# Patient Record
Sex: Female | Born: 1960 | Race: White | Hispanic: No | Marital: Married | Smoking: Never smoker
Health system: Southern US, Community
[De-identification: ages and names within clinical notes are randomized; demographics above are authoritative.]

## PROBLEM LIST (undated history)

## (undated) DIAGNOSIS — I10 Essential (primary) hypertension: Secondary | ICD-10-CM

## (undated) DIAGNOSIS — I739 Peripheral vascular disease, unspecified: Secondary | ICD-10-CM

## (undated) DIAGNOSIS — E785 Hyperlipidemia, unspecified: Secondary | ICD-10-CM

## (undated) HISTORY — DX: Hyperlipidemia, unspecified: E78.5

## (undated) HISTORY — DX: Peripheral vascular disease, unspecified: I73.9

## (undated) HISTORY — DX: Essential (primary) hypertension: I10

## (undated) HISTORY — PX: OTHER SURGICAL HISTORY: SHX169

## (undated) HISTORY — PX: CATARACT EXTRACTION: SUR2

---

## 1998-08-21 ENCOUNTER — Other Ambulatory Visit: Admission: RE | Admit: 1998-08-21 | Discharge: 1998-08-21 | Payer: Self-pay | Admitting: Obstetrics and Gynecology

## 1999-10-05 ENCOUNTER — Other Ambulatory Visit: Admission: RE | Admit: 1999-10-05 | Discharge: 1999-10-05 | Payer: Self-pay | Admitting: Obstetrics and Gynecology

## 1999-10-14 ENCOUNTER — Encounter: Admission: RE | Admit: 1999-10-14 | Discharge: 1999-10-14 | Payer: Self-pay | Admitting: Obstetrics and Gynecology

## 1999-10-14 ENCOUNTER — Encounter: Payer: Self-pay | Admitting: Obstetrics and Gynecology

## 2000-05-04 ENCOUNTER — Ambulatory Visit (HOSPITAL_COMMUNITY): Admission: RE | Admit: 2000-05-04 | Discharge: 2000-05-04 | Payer: Self-pay | Admitting: *Deleted

## 2000-05-04 ENCOUNTER — Encounter: Payer: Self-pay | Admitting: *Deleted

## 2000-10-19 ENCOUNTER — Other Ambulatory Visit: Admission: RE | Admit: 2000-10-19 | Discharge: 2000-10-19 | Payer: Self-pay | Admitting: Obstetrics and Gynecology

## 2001-11-20 ENCOUNTER — Other Ambulatory Visit: Admission: RE | Admit: 2001-11-20 | Discharge: 2001-11-20 | Payer: Self-pay | Admitting: Obstetrics and Gynecology

## 2003-01-14 ENCOUNTER — Other Ambulatory Visit: Admission: RE | Admit: 2003-01-14 | Discharge: 2003-01-14 | Payer: Self-pay | Admitting: Obstetrics and Gynecology

## 2004-01-24 ENCOUNTER — Other Ambulatory Visit: Admission: RE | Admit: 2004-01-24 | Discharge: 2004-01-24 | Payer: Self-pay | Admitting: Obstetrics and Gynecology

## 2005-03-17 ENCOUNTER — Other Ambulatory Visit: Admission: RE | Admit: 2005-03-17 | Discharge: 2005-03-17 | Payer: Self-pay | Admitting: Obstetrics and Gynecology

## 2008-07-30 ENCOUNTER — Encounter: Admission: RE | Admit: 2008-07-30 | Discharge: 2008-07-30 | Payer: Self-pay | Admitting: Obstetrics and Gynecology

## 2009-07-06 ENCOUNTER — Inpatient Hospital Stay (HOSPITAL_COMMUNITY): Admission: AD | Admit: 2009-07-06 | Discharge: 2009-07-06 | Payer: Self-pay | Admitting: Obstetrics and Gynecology

## 2009-08-15 ENCOUNTER — Encounter: Admission: RE | Admit: 2009-08-15 | Discharge: 2009-08-15 | Payer: Self-pay | Admitting: Obstetrics and Gynecology

## 2010-08-26 ENCOUNTER — Encounter
Admission: RE | Admit: 2010-08-26 | Discharge: 2010-08-26 | Payer: Self-pay | Source: Home / Self Care | Attending: Obstetrics and Gynecology | Admitting: Obstetrics and Gynecology

## 2010-12-10 LAB — CBC
Hemoglobin: 12.3 g/dL (ref 12.0–15.0)
MCHC: 34.8 g/dL (ref 30.0–36.0)
RBC: 3.83 MIL/uL — ABNORMAL LOW (ref 3.87–5.11)
WBC: 5.5 10*3/uL (ref 4.0–10.5)

## 2011-07-13 ENCOUNTER — Other Ambulatory Visit: Payer: Self-pay | Admitting: Obstetrics and Gynecology

## 2011-07-13 DIAGNOSIS — Z1231 Encounter for screening mammogram for malignant neoplasm of breast: Secondary | ICD-10-CM

## 2011-09-03 ENCOUNTER — Ambulatory Visit
Admission: RE | Admit: 2011-09-03 | Discharge: 2011-09-03 | Disposition: A | Discharge status: Home / Self Care | Payer: Commercial Managed Care - PPO | Source: Ambulatory Visit | Attending: Obstetrics and Gynecology | Admitting: Obstetrics and Gynecology

## 2011-09-03 DIAGNOSIS — Z1231 Encounter for screening mammogram for malignant neoplasm of breast: Secondary | ICD-10-CM

## 2012-07-11 ENCOUNTER — Other Ambulatory Visit: Payer: Self-pay | Admitting: Obstetrics and Gynecology

## 2012-07-11 DIAGNOSIS — Z1231 Encounter for screening mammogram for malignant neoplasm of breast: Secondary | ICD-10-CM

## 2012-09-28 ENCOUNTER — Ambulatory Visit
Admission: RE | Admit: 2012-09-28 | Discharge: 2012-09-28 | Disposition: A | Discharge status: Home / Self Care | Payer: Commercial Managed Care - PPO | Source: Ambulatory Visit | Attending: Obstetrics and Gynecology | Admitting: Obstetrics and Gynecology

## 2012-09-28 DIAGNOSIS — Z1231 Encounter for screening mammogram for malignant neoplasm of breast: Secondary | ICD-10-CM

## 2013-09-21 ENCOUNTER — Other Ambulatory Visit: Payer: Self-pay

## 2013-09-21 DIAGNOSIS — Z1231 Encounter for screening mammogram for malignant neoplasm of breast: Secondary | ICD-10-CM

## 2013-10-12 ENCOUNTER — Ambulatory Visit
Admission: RE | Admit: 2013-10-12 | Discharge: 2013-10-12 | Disposition: A | Discharge status: Home / Self Care | Payer: 59 | Source: Ambulatory Visit

## 2013-10-12 DIAGNOSIS — Z1231 Encounter for screening mammogram for malignant neoplasm of breast: Secondary | ICD-10-CM

## 2013-10-16 ENCOUNTER — Other Ambulatory Visit: Payer: Self-pay | Admitting: Obstetrics and Gynecology

## 2013-10-16 DIAGNOSIS — R928 Other abnormal and inconclusive findings on diagnostic imaging of breast: Secondary | ICD-10-CM

## 2013-10-19 ENCOUNTER — Ambulatory Visit
Admission: RE | Admit: 2013-10-19 | Discharge: 2013-10-19 | Disposition: A | Discharge status: Home / Self Care | Payer: 59 | Source: Ambulatory Visit | Attending: Obstetrics and Gynecology | Admitting: Obstetrics and Gynecology

## 2013-10-19 DIAGNOSIS — R928 Other abnormal and inconclusive findings on diagnostic imaging of breast: Secondary | ICD-10-CM

## 2014-01-05 ENCOUNTER — Encounter (HOSPITAL_COMMUNITY): Payer: Self-pay | Admitting: Emergency Medicine

## 2014-01-05 ENCOUNTER — Emergency Department (HOSPITAL_COMMUNITY)
Admission: EM | Admit: 2014-01-05 | Discharge: 2014-01-06 | Disposition: A | Discharge status: Home / Self Care | Payer: 59 | Attending: Emergency Medicine | Admitting: Emergency Medicine

## 2014-01-05 ENCOUNTER — Emergency Department (HOSPITAL_COMMUNITY): Payer: 59

## 2014-01-05 DIAGNOSIS — S01501A Unspecified open wound of lip, initial encounter: Secondary | ICD-10-CM | POA: Insufficient documentation

## 2014-01-05 DIAGNOSIS — S6390XA Sprain of unspecified part of unspecified wrist and hand, initial encounter: Secondary | ICD-10-CM | POA: Insufficient documentation

## 2014-01-05 DIAGNOSIS — W19XXXA Unspecified fall, initial encounter: Secondary | ICD-10-CM

## 2014-01-05 DIAGNOSIS — S01511A Laceration without foreign body of lip, initial encounter: Secondary | ICD-10-CM

## 2014-01-05 DIAGNOSIS — IMO0002 Reserved for concepts with insufficient information to code with codable children: Secondary | ICD-10-CM | POA: Insufficient documentation

## 2014-01-05 DIAGNOSIS — Y929 Unspecified place or not applicable: Secondary | ICD-10-CM | POA: Insufficient documentation

## 2014-01-05 DIAGNOSIS — S6392XA Sprain of unspecified part of left wrist and hand, initial encounter: Secondary | ICD-10-CM

## 2014-01-05 DIAGNOSIS — S8002XA Contusion of left knee, initial encounter: Secondary | ICD-10-CM

## 2014-01-05 DIAGNOSIS — T07XXXA Unspecified multiple injuries, initial encounter: Secondary | ICD-10-CM

## 2014-01-05 DIAGNOSIS — S025XXA Fracture of tooth (traumatic), initial encounter for closed fracture: Secondary | ICD-10-CM | POA: Insufficient documentation

## 2014-01-05 DIAGNOSIS — S8000XA Contusion of unspecified knee, initial encounter: Secondary | ICD-10-CM | POA: Insufficient documentation

## 2014-01-05 DIAGNOSIS — Y93K1 Activity, walking an animal: Secondary | ICD-10-CM | POA: Insufficient documentation

## 2014-01-05 DIAGNOSIS — Z23 Encounter for immunization: Secondary | ICD-10-CM | POA: Insufficient documentation

## 2014-01-05 DIAGNOSIS — S0993XA Unspecified injury of face, initial encounter: Secondary | ICD-10-CM

## 2014-01-05 DIAGNOSIS — W1809XA Striking against other object with subsequent fall, initial encounter: Secondary | ICD-10-CM | POA: Insufficient documentation

## 2014-01-05 MED ORDER — TETANUS-DIPHTH-ACELL PERTUSSIS 5-2.5-18.5 LF-MCG/0.5 IM SUSP
0.5000 mL | Freq: Once | INTRAMUSCULAR | Status: AC
Start: 2014-01-05 — End: 2014-01-05
  Administered 2014-01-05: 0.5 mL via INTRAMUSCULAR
  Filled 2014-01-05: qty 0.5

## 2014-01-05 NOTE — ED Notes (Signed)
Pt states she was out walking and tripped on the lip of the sidewalk causing her to fall   Pt has facial trauma noted with a laceration below her left eye, bruising noted, laceration to her lip and injury to her front left tooth  Pt also has an abrasion to her top lip and c/o pain to her left hand and left knee

## 2014-01-05 NOTE — ED Provider Notes (Signed)
CSN: [id number]     Arrival date & time 01/05/14  2220 History   First MD Initiated Contact with Patient 01/05/14 2244     Chief Complaint  Patient presents with  . Fall   HPI  History provided by the patient. The patient is a 53 year old female with no significant PMH presenting with injuries after a fall. Patient was walking her dogs outside tripped over a crack in the sidewalk causing her to fall forward hitting her left knee and left hand and left face. She has multiple abrasions with small amounts of bleeding. Denies any LOC. Patient took 2 Tylenol and use some ice over swelling of her face with some improvements. He continues to have pains in her left hand and face area. Mild pains in knees. She has been a ambulatory does not report significant pain with walking or pressure. Patient is unsure of her last tetanus shot. No other aggravating or alleviating factors. No neck or back pains. No chest pains or shortness of breath. No weakness or numbness in extremities.    History reviewed. No pertinent past medical history. Past Surgical History  Procedure Laterality Date  . C section x 2     Family History  Problem Relation Age of Onset  . Cancer Other    History  Substance Use Topics  . Smoking status: Never Smoker   . Smokeless tobacco: Not on file  . Alcohol Use: No   OB History   Grav Para Term Preterm Abortions TAB SAB Ect Mult Living                 Review of Systems  Eyes: Negative for visual disturbance.  Gastrointestinal: Negative for nausea and vomiting.  Musculoskeletal: Negative for back pain and neck pain.  All other systems reviewed and are negative.     Allergies  Review of patient's allergies indicates no known allergies.  Home Medications   Prior to Admission medications   Not on File   BP 131/73  Pulse 63  Temp(Src) 98.7 F (37.1 C) (Oral)  Resp 19  Ht 5\' 6"  (1.676 m)  Wt 159 lb (72.122 kg)  BMI 25.68 kg/m2  SpO2 100% Physical Exam  Nursing  note and vitals reviewed. Constitutional: She is oriented to person, place, and time. She appears well-developed and well-nourished. No distress.  HENT:  Head: Normocephalic.  Abrasions over the left face and cheek area. There is small laceration to lower lip. Swelling of the upper lip. There is injury to the left upper central incisor with displacement into the mouth. No fracture to the tooth. No bleeding of the gums. Nose is normal.  Eyes: Conjunctivae and EOM are normal. Pupils are equal, round, and reactive to light.  Cardiovascular: Normal rate and regular rhythm.   Pulmonary/Chest: Effort normal and breath sounds normal.  Abdominal: Soft.  Neurological: She is alert and oriented to person, place, and time. She has normal strength. No cranial nerve deficit or sensory deficit.  Skin: Skin is warm and dry. No rash noted.  Psychiatric: She has a normal mood and affect. Her behavior is normal.    ED Course  Procedures   COORDINATION OF CARE:  Nursing notes reviewed. Vital signs reviewed. Initial pt interview and examination performed.   Filed Vitals:   01/05/14 2229  BP: 131/73  Pulse: 63  Temp: 98.7 F (37.1 C)  TempSrc: Oral  Resp: 19  Height: 5\' 6"  (1.676 m)  Weight: 159 lb (72.122 kg)  SpO2: 100%  11:05 PM-patient seen and evaluated. Patient signs of injuries from a fall. Awake and alert. No LOC. Discussed plan for x-rays of left hand and CT scan of face. Patient agrees. She does not request any pain medication at this time.  X-rays reviewed with patient. No signs of fractures or other concerning injuries. Discussed findings with patient. We'll place him in a splint. Patient given dental oral surgery referral for dental injury. Patient instructed to use rest, ice, compression and elevation.  She agrees with plan.  Imaging Review Dg Hand Complete Left  01/05/2014   CLINICAL DATA:  Fall.  EXAM: LEFT HAND - COMPLETE 3+ VIEW  COMPARISON:  None.  FINDINGS: Degenerative  changes in the carpus and interphalangeal joints. No evidence of acute fracture or dislocation in the left hand. Soft tissues are unremarkable.  IMPRESSION: Degenerative changes.  No acute bony abnormalities.   Electronically Signed   By: Burman NievesWilliam  Stevens M.D.   On: 01/05/2014 23:32   Ct Maxillofacial Wo Cm  01/05/2014   CLINICAL DATA:  Injury after a fall. Laceration to the left cheek and concerned about anterior to the injury. Bruising. Pain.  EXAM: CT MAXILLOFACIAL WITHOUT CONTRAST  TECHNIQUE: Multidetector CT imaging of the maxillofacial structures was performed. Multiplanar CT image reconstructions were also generated. A small metallic BB was placed on the right temple in order to reliably differentiate right from left.  COMPARISON:  None.  FINDINGS: Soft tissue swelling/ hematomas over the maxillary regions bilaterally, greater on the left. The globes and extraocular muscles appear intact and symmetrical. The orbital rims, maxillary antral walls, nasal bones, nasal septum, maxilla, zygomatic arches, pterygoid plates, mandibles, and temporomandibular joints appear intact. No displaced fractures identified. 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 Paranasal sinuses are clear.  IMPRESSION: No acute orbital or facial fractures demonstrated.   Electronically Signed   By: Burman NievesWilliam  Stevens M.D.   On: 01/05/2014 23:38     MDM   Final diagnoses:  Fall  Abrasions of multiple sites  Dental injury  Laceration of lip  Sprain of hand, left  Contusion of knee, left        Angus Sellereter S Adrian Specht, PA-C 01/06/14 0007

## 2014-01-05 NOTE — Discharge Instructions (Signed)
Your x-ray and CAT scan did not show any signs of concerning fractures or injuries from your fall. At this time your providers feel you may return home and followup with the primary care provider. Use rest, ice, compression and elevation to reduce pain and swelling of your injured areas. Please also follow up with a dentist for continued evaluation of your dental injuries.    Abrasions An abrasion is a cut or scrape of the skin. Abrasions do not go through all layers of the skin. HOME CARE  If a bandage (dressing) was put on your wound, change it as told by your doctor. If the bandage sticks, soak it off with warm.  Wash the area with water and soap 2 times a day. Rinse off the soap. Pat the area dry with a clean towel.  Put on medicated cream (ointment) as told by your doctor.  Change your bandage right away if it gets wet or dirty.  Only take medicine as told by your doctor.  See your doctor within 24 48 hours to get your wound checked.  Check your wound for redness, puffiness (swelling), or yellowish-white fluid (pus). GET HELP RIGHT AWAY IF:   You have more pain in the wound.  You have redness, swelling, or tenderness around the wound.  You have pus coming from the wound.  You have a fever or lasting symptoms for more than 2 3 days.  You have a fever and your symptoms suddenly get worse.  You have a bad smell coming from the wound or bandage. MAKE SURE YOU:   Understand these instructions.  Will watch your condition.  Will get help right away if you are not doing well or get worse. Document Released: 02/09/2008 Document Revised: 05/17/2012 Document Reviewed: 07/27/2011 [facility] Patient Information 2014 [place], [place].   Dental Injury Your exam shows that you have injured your teeth. The treatment of broken teeth and other dental injuries depends on how badly they are hurt. All dental injuries should be checked as soon as possible by a dentist if there are:  Loose  teeth which may need to be wired or bonded with a plastic device to hold them in place.  Broken teeth with exposed tooth pulp which may cause a serious infection.  Painful teeth especially when you bite or chew.  Sharp tooth edges that cut your tongue or lips. Sometimes, antibiotics or pain medicine are prescribed to prevent infection and control pain. Eat a soft or liquid diet and rinse your mouth out after meals with warm water. You should see a dentist or return here at once if you have increased swelling, increased pain or uncontrolled bleeding from the site of your injury. SEEK MEDICAL CARE IF:   You have increased pain not controlled with medicines.  You have swelling around your tooth, in your face or neck.  You have bleeding which starts, continues, or gets worse.  You have a fever. Document Released: 08/23/2005 Document Revised: 11/15/2011 Document Reviewed: 08/22/2009 [facility] Patient Information 2014 [place], [place].   Sprain A sprain happens when the bands of tissue that connect bones and hold joints together (ligaments) stretch too much or tear. HOME CARE  Raise (elevate) the injured area to lessen puffiness (swelling).  Put ice on the injured area 2 times a day for 2 3 days.  Put ice in a plastic bag.  Place a towel between your skin and the bag.  Leave the ice on for 15 minutes.  Only take medicine as told  by your doctor.  Protect your injured area until your pain and stiffness go away.  Do not get your cast or splint wet. Cover your cast or splint with a plastic bag when you shower or take a bath. Do not swim in a pool.  Your doctor may suggest exercises during your recovery to keep from getting stiff. GET HELP RIGHT AWAY IF:   Your cast or splint becomes damaged.  Your pain gets worse. MAKE SURE YOU:   Understand these instructions.  Will watch this condition.  Will get help right away if you are not doing well or get worse. Document Released:  02/09/2008 Document Revised: 06/13/2013 Document Reviewed: 09/04/2011 [facility] Patient Information 2014 [place], [place].

## 2014-01-09 NOTE — ED Provider Notes (Signed)
Medical screening examination/treatment/procedure(s) were performed by non-physician practitioner and as supervising physician I was immediately available for consultation/collaboration.   Dallas Scorsone M Jaquane Boughner, MD 01/09/14 2219 

## 2014-03-19 ENCOUNTER — Other Ambulatory Visit: Payer: Self-pay | Admitting: Obstetrics and Gynecology

## 2014-03-19 DIAGNOSIS — N6001 Solitary cyst of right breast: Secondary | ICD-10-CM

## 2014-03-22 ENCOUNTER — Other Ambulatory Visit: Payer: Self-pay | Admitting: Obstetrics and Gynecology

## 2014-03-22 DIAGNOSIS — Z803 Family history of malignant neoplasm of breast: Secondary | ICD-10-CM

## 2014-04-08 ENCOUNTER — Ambulatory Visit
Admission: RE | Admit: 2014-04-08 | Discharge: 2014-04-08 | Disposition: A | Discharge status: Home / Self Care | Payer: 59 | Source: Ambulatory Visit | Attending: Obstetrics and Gynecology | Admitting: Obstetrics and Gynecology

## 2014-04-08 DIAGNOSIS — N6001 Solitary cyst of right breast: Secondary | ICD-10-CM

## 2014-09-17 ENCOUNTER — Other Ambulatory Visit: Payer: Self-pay | Admitting: Obstetrics and Gynecology

## 2014-09-17 DIAGNOSIS — N6310 Unspecified lump in the right breast, unspecified quadrant: Secondary | ICD-10-CM

## 2014-10-15 ENCOUNTER — Ambulatory Visit
Admission: RE | Admit: 2014-10-15 | Discharge: 2014-10-15 | Disposition: A | Discharge status: Home / Self Care | Payer: 59 | Source: Ambulatory Visit | Attending: Obstetrics and Gynecology | Admitting: Obstetrics and Gynecology

## 2014-10-15 DIAGNOSIS — N6310 Unspecified lump in the right breast, unspecified quadrant: Secondary | ICD-10-CM

## 2014-12-14 ENCOUNTER — Telehealth: Payer: Self-pay | Admitting: Nurse Practitioner

## 2014-12-14 DIAGNOSIS — N3000 Acute cystitis without hematuria: Secondary | ICD-10-CM

## 2014-12-14 MED ORDER — CIPROFLOXACIN HCL 500 MG PO TABS: 500.0000 mg | ORAL_TABLET | Freq: Two times a day (BID) | ORAL | Status: DC

## 2014-12-14 NOTE — Progress Notes (Signed)
We are sorry that you are not feeling well.  Here is how we plan to help!  Based on what you shared with me it looks like you most likely have a simple urinary tract infection.  A UTI (Urinary Tract Infection) is a bacterial infection of the bladder.  Most cases of urinary tract infections are simple to treat but a key part of your care is to encourage you to drink plenty of fluids and watch your symptoms carefully.  I have prescribed cipro 1 bid for 3 days..  Your symptoms should gradually improve. Call us if the burning in your urine worsens, you develop worsening fever, back pain or pelvic pain or if your symptoms do not resolve after completing the antibiotic.  Urinary tract infections can be prevented by drinking plenty of water to keep your body hydrated.  Also be sure when you wipe, wipe from front to back and don't hold it in!  If possible, empty your bladder every 4 hours.  Your e-visit answers were reviewed by a board certified advanced clinical practitioner to complete your personal care plan.  Depending on the condition, your plan could have included both over the counter or prescription medications.  If there is a problem please reply  once you have received a response from your provider.  Your safety is important to us.  If you have drug allergies check your prescription carefully.    You can use MyChart to ask questions about today's visit, request a non-urgent call back, or ask for a work or school excuse.  You will get an e-mail in the next two days asking about your experience.  I hope that your e-visit has been valuable and will speed your recovery. Thank you for using e-visits.

## 2014-12-14 NOTE — Progress Notes (Signed)
Waiting on patient response. questionaire not complete.

## 2014-12-14 NOTE — Addendum Note (Signed)
Addended by: Bennie PieriniMARTIN, Oree-MARGARET on: 12/14/2014 11:31 AM   Modules accepted: Orders

## 2015-07-29 IMAGING — CT CT MAXILLOFACIAL W/O CM
1 series · 16 of 30 positions shown, 20 images · non-contrast
Comparison: None.

CLINICAL DATA: Injury after a fall. Laceration to the left cheek
and concerned about anterior to the injury. Bruising. Pain.

EXAM:
CT MAXILLOFACIAL WITHOUT CONTRAST
TECHNIQUE: Multidetector CT imaging of the maxillofacial structures was
performed. Multiplanar CT image reconstructions were also generated.
A small metallic BB was placed on the right temple in order to
reliably differentiate right from left.

[Series 3: facial st · axial · 0.32mm/px · z∈[-218,-70]mm · 16 of 80 slices shown, 20 images]
[im 3/80  brain]
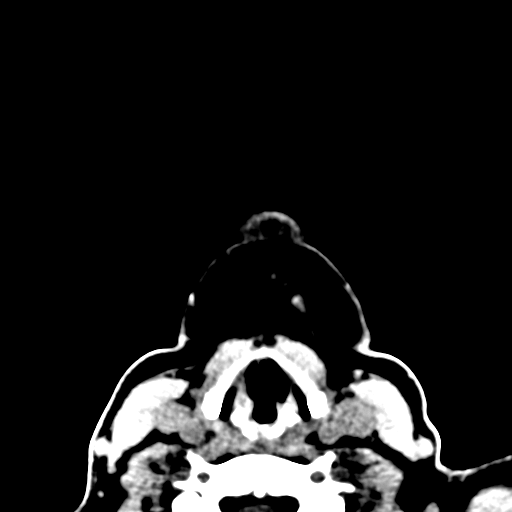
[im 3/80  bone]
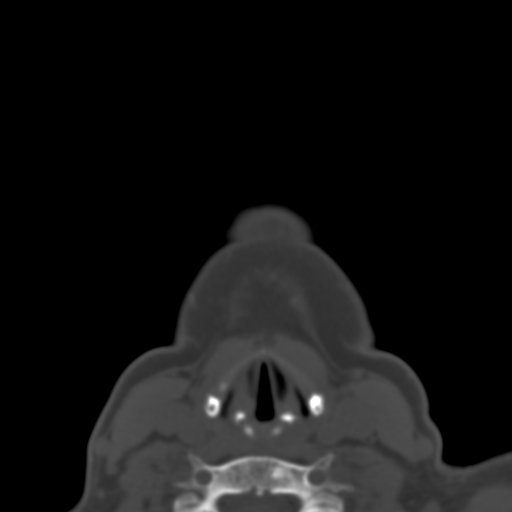
[im 9/80  bone]
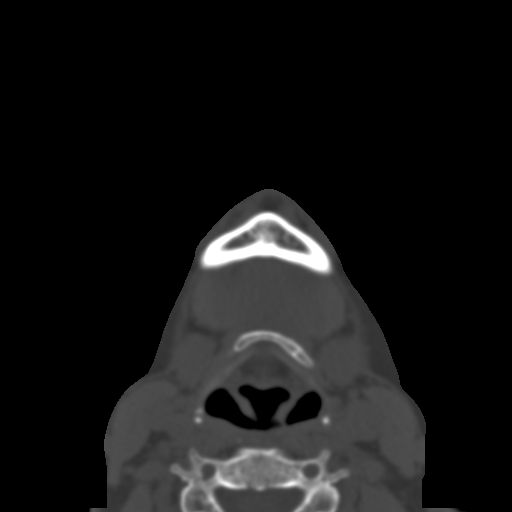
[im 14/80  bone]
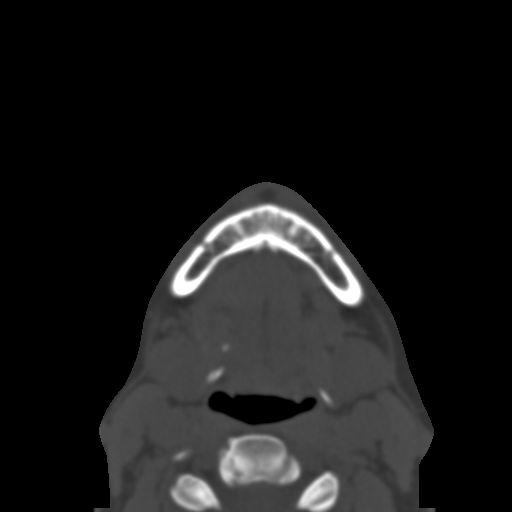
[im 20/80  bone]
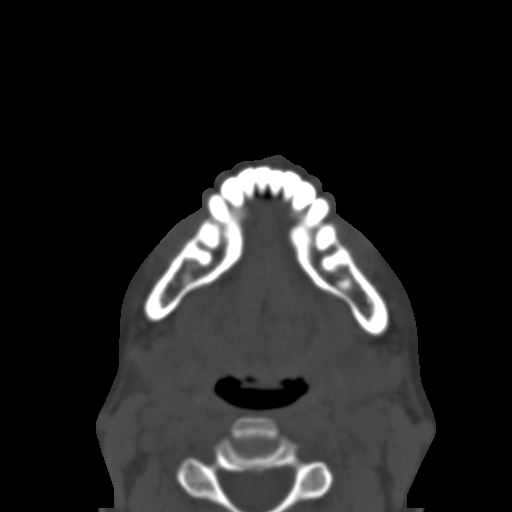
[im 22/80  brain]
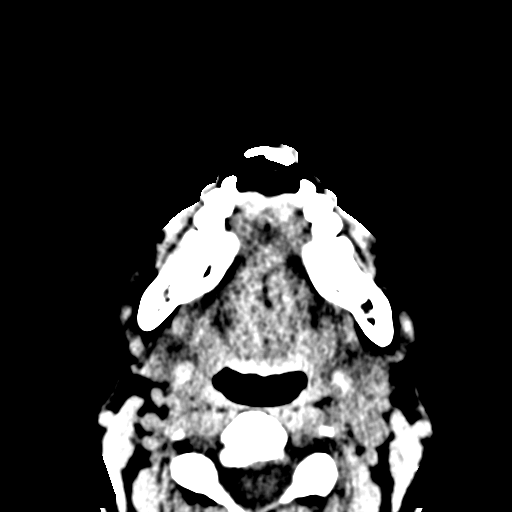
[im 22/80  bone]
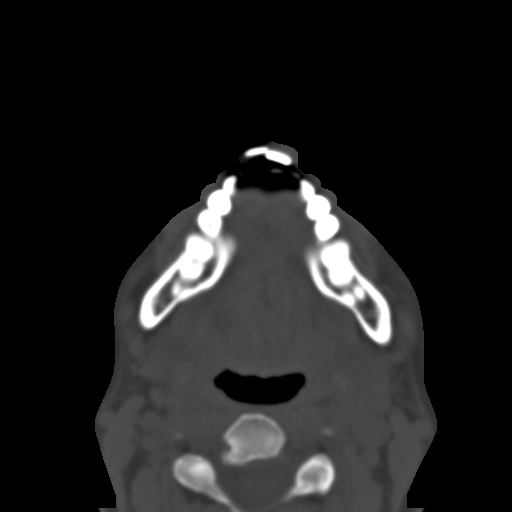
[im 28/80  bone]
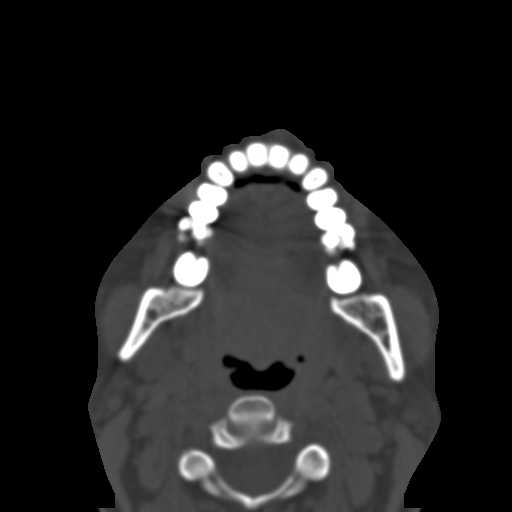
[im 33/80  bone]
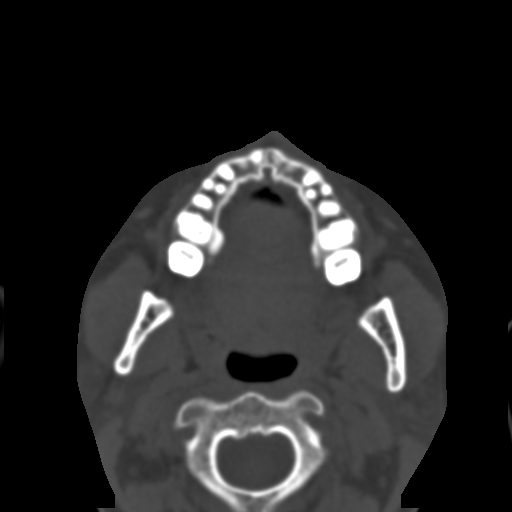
[im 39/80  bone]
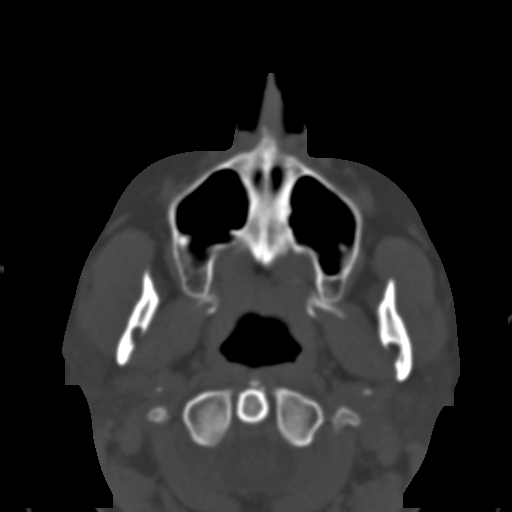
[im 41/80  brain]
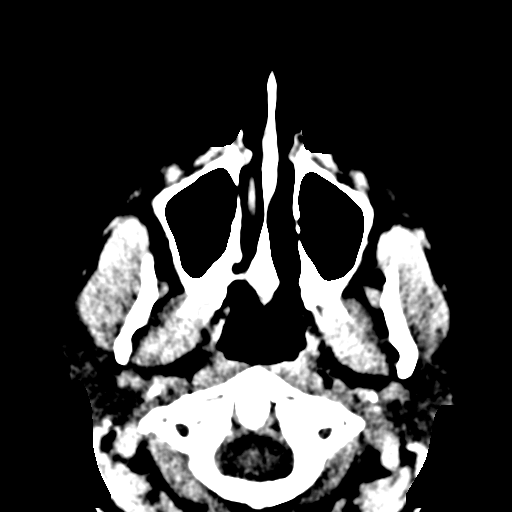
[im 41/80  bone]
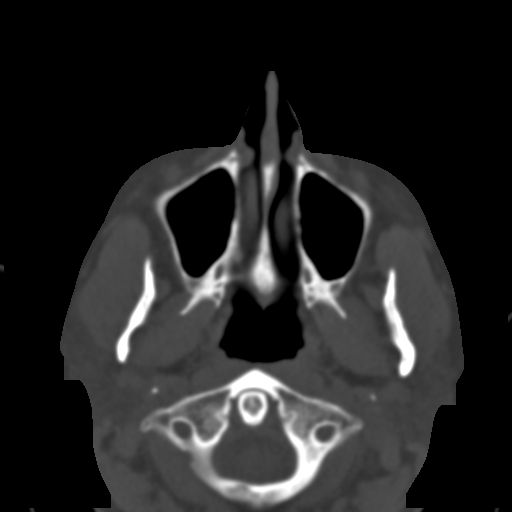
[im 47/80  bone]
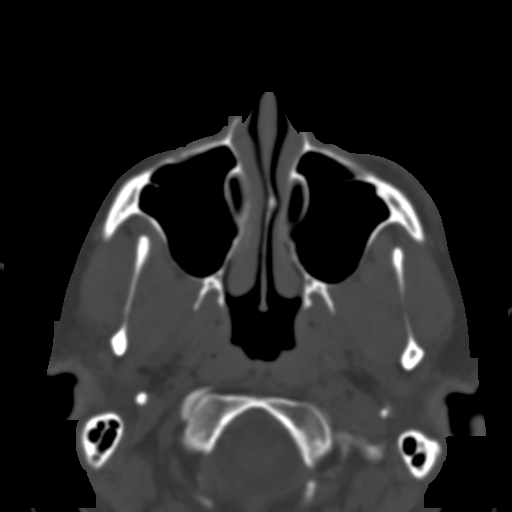
[im 52/80  bone]
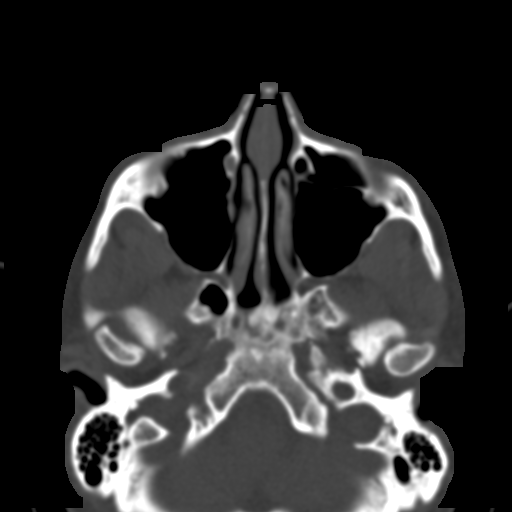
[im 58/80  bone]
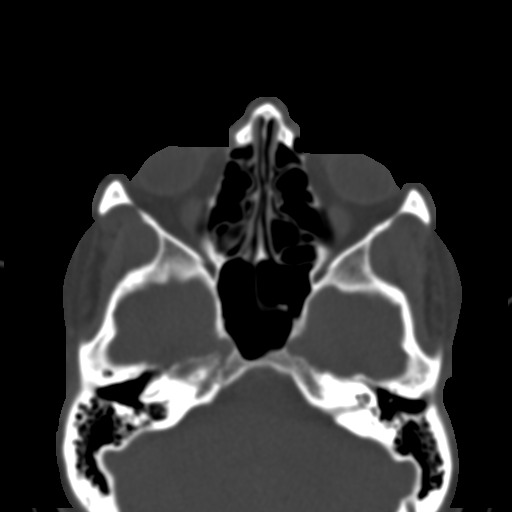
[im 60/80  brain]
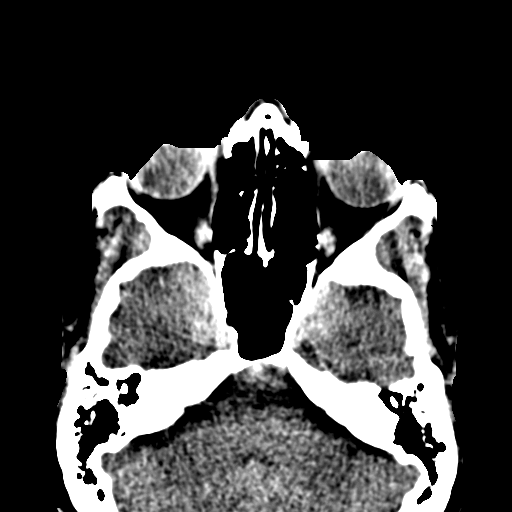
[im 60/80  bone]
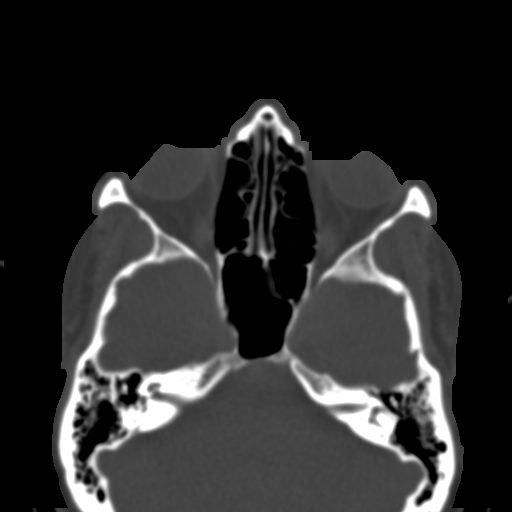
[im 66/80  bone]
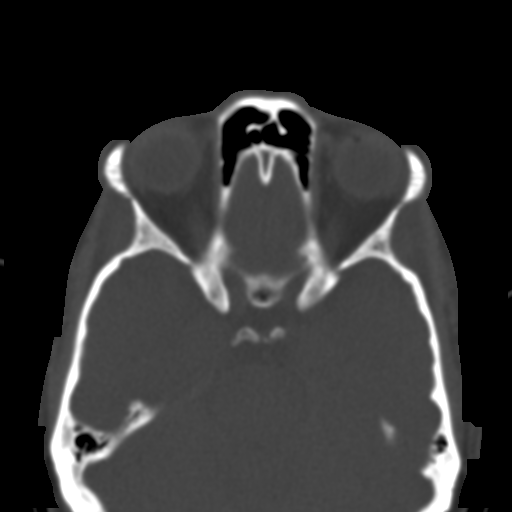
[im 71/80  bone]
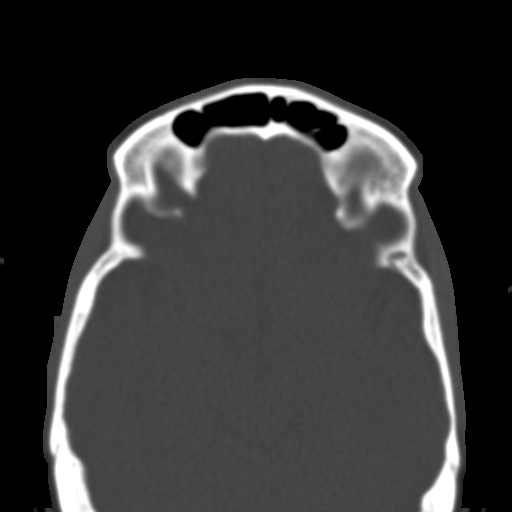
[im 77/80  bone]
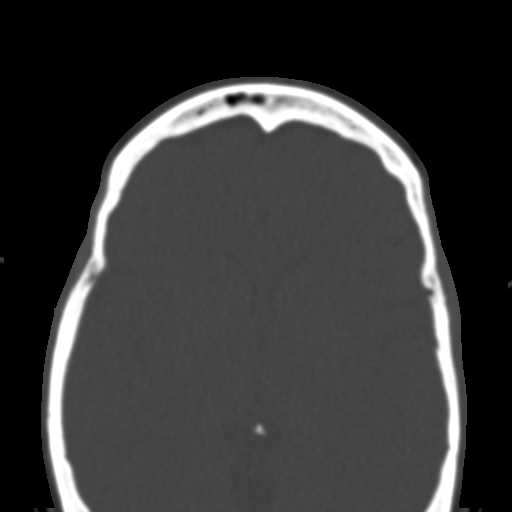

[16 of 30 positions shown; findings below may reference images not displayed]

FINDINGS: Soft tissue swelling/ hematomas over the maxillary regions
bilaterally, greater on the left. The globes and extraocular muscles
appear intact and symmetrical. The orbital rims, maxillary antral
walls, nasal bones, nasal septum, maxilla, zygomatic arches,
pterygoid plates, mandibles, and temporomandibular joints appear
intact. No displaced fractures identified. Visualized cervical spine
demonstrates reversal of the usual cervical lordosis with presumed
congenital coalition of C5-C6. Paranasal sinuses are clear.
IMPRESSION: No acute orbital or facial fractures demonstrated.

## 2015-10-08 ENCOUNTER — Other Ambulatory Visit: Payer: Self-pay

## 2015-10-08 DIAGNOSIS — Z1231 Encounter for screening mammogram for malignant neoplasm of breast: Secondary | ICD-10-CM

## 2015-11-05 ENCOUNTER — Ambulatory Visit
Admission: RE | Admit: 2015-11-05 | Discharge: 2015-11-05 | Disposition: A | Discharge status: Home / Self Care | Payer: 59 | Source: Ambulatory Visit

## 2015-11-05 DIAGNOSIS — Z1231 Encounter for screening mammogram for malignant neoplasm of breast: Secondary | ICD-10-CM

## 2015-11-07 ENCOUNTER — Other Ambulatory Visit: Payer: Self-pay | Admitting: Obstetrics and Gynecology

## 2015-11-07 DIAGNOSIS — R928 Other abnormal and inconclusive findings on diagnostic imaging of breast: Secondary | ICD-10-CM

## 2015-11-18 ENCOUNTER — Other Ambulatory Visit: Payer: Self-pay | Admitting: Obstetrics and Gynecology

## 2015-11-18 DIAGNOSIS — R928 Other abnormal and inconclusive findings on diagnostic imaging of breast: Secondary | ICD-10-CM

## 2015-11-20 ENCOUNTER — Ambulatory Visit
Admission: RE | Admit: 2015-11-20 | Discharge: 2015-11-20 | Disposition: A | Discharge status: Home / Self Care | Payer: 59 | Source: Ambulatory Visit | Attending: Obstetrics and Gynecology | Admitting: Obstetrics and Gynecology

## 2015-11-20 DIAGNOSIS — R928 Other abnormal and inconclusive findings on diagnostic imaging of breast: Secondary | ICD-10-CM

## 2016-11-04 ENCOUNTER — Other Ambulatory Visit: Payer: Self-pay | Admitting: Obstetrics and Gynecology

## 2016-11-04 DIAGNOSIS — Z1231 Encounter for screening mammogram for malignant neoplasm of breast: Secondary | ICD-10-CM

## 2016-12-15 ENCOUNTER — Ambulatory Visit
Admission: RE | Admit: 2016-12-15 | Discharge: 2016-12-15 | Disposition: A | Discharge status: Home / Self Care | Payer: 59 | Source: Ambulatory Visit | Attending: Obstetrics and Gynecology | Admitting: Obstetrics and Gynecology

## 2016-12-15 DIAGNOSIS — Z1231 Encounter for screening mammogram for malignant neoplasm of breast: Secondary | ICD-10-CM

## 2017-11-02 ENCOUNTER — Other Ambulatory Visit: Payer: Self-pay | Admitting: Obstetrics and Gynecology

## 2017-11-02 DIAGNOSIS — Z1231 Encounter for screening mammogram for malignant neoplasm of breast: Secondary | ICD-10-CM

## 2017-12-22 ENCOUNTER — Ambulatory Visit
Admission: RE | Admit: 2017-12-22 | Discharge: 2017-12-22 | Disposition: A | Discharge status: Home / Self Care | Payer: BLUE CROSS/BLUE SHIELD | Source: Ambulatory Visit | Attending: Obstetrics and Gynecology | Admitting: Obstetrics and Gynecology

## 2017-12-22 DIAGNOSIS — Z1231 Encounter for screening mammogram for malignant neoplasm of breast: Secondary | ICD-10-CM

## 2018-03-30 DIAGNOSIS — M25561 Pain in right knee: Secondary | ICD-10-CM | POA: Diagnosis not present

## 2018-03-30 DIAGNOSIS — M170 Bilateral primary osteoarthritis of knee: Secondary | ICD-10-CM | POA: Diagnosis not present

## 2018-07-12 DIAGNOSIS — Z961 Presence of intraocular lens: Secondary | ICD-10-CM | POA: Diagnosis not present

## 2018-07-12 DIAGNOSIS — Z01419 Encounter for gynecological examination (general) (routine) without abnormal findings: Secondary | ICD-10-CM | POA: Diagnosis not present

## 2018-07-12 DIAGNOSIS — Z6827 Body mass index (BMI) 27.0-27.9, adult: Secondary | ICD-10-CM | POA: Diagnosis not present

## 2018-07-12 DIAGNOSIS — H43813 Vitreous degeneration, bilateral: Secondary | ICD-10-CM | POA: Diagnosis not present

## 2018-07-18 DIAGNOSIS — Z1321 Encounter for screening for nutritional disorder: Secondary | ICD-10-CM | POA: Diagnosis not present

## 2018-07-18 DIAGNOSIS — Z8042 Family history of malignant neoplasm of prostate: Secondary | ICD-10-CM | POA: Diagnosis not present

## 2018-07-18 DIAGNOSIS — Z803 Family history of malignant neoplasm of breast: Secondary | ICD-10-CM | POA: Diagnosis not present

## 2018-07-18 DIAGNOSIS — E785 Hyperlipidemia, unspecified: Secondary | ICD-10-CM | POA: Diagnosis not present

## 2018-07-18 DIAGNOSIS — Z1329 Encounter for screening for other suspected endocrine disorder: Secondary | ICD-10-CM | POA: Diagnosis not present

## 2018-07-18 DIAGNOSIS — Z800 Family history of malignant neoplasm of digestive organs: Secondary | ICD-10-CM | POA: Diagnosis not present

## 2018-07-18 DIAGNOSIS — Z13228 Encounter for screening for other metabolic disorders: Secondary | ICD-10-CM | POA: Diagnosis not present

## 2018-10-04 DIAGNOSIS — E559 Vitamin D deficiency, unspecified: Secondary | ICD-10-CM | POA: Diagnosis not present

## 2018-10-04 DIAGNOSIS — Z809 Family history of malignant neoplasm, unspecified: Secondary | ICD-10-CM | POA: Diagnosis not present

## 2018-11-08 ENCOUNTER — Other Ambulatory Visit: Payer: Self-pay | Admitting: Obstetrics and Gynecology

## 2018-11-08 DIAGNOSIS — Z1231 Encounter for screening mammogram for malignant neoplasm of breast: Secondary | ICD-10-CM

## 2018-12-28 ENCOUNTER — Ambulatory Visit: Payer: BLUE CROSS/BLUE SHIELD

## 2019-01-23 DIAGNOSIS — E785 Hyperlipidemia, unspecified: Secondary | ICD-10-CM | POA: Diagnosis not present

## 2019-02-16 ENCOUNTER — Ambulatory Visit
Admission: RE | Admit: 2019-02-16 | Discharge: 2019-02-16 | Disposition: A | Discharge status: Home / Self Care | Payer: BC Managed Care – PPO | Source: Ambulatory Visit | Attending: Obstetrics and Gynecology | Admitting: Obstetrics and Gynecology

## 2019-02-16 ENCOUNTER — Other Ambulatory Visit: Payer: Self-pay

## 2019-02-16 DIAGNOSIS — Z1231 Encounter for screening mammogram for malignant neoplasm of breast: Secondary | ICD-10-CM | POA: Diagnosis not present

## 2019-02-23 DIAGNOSIS — Z20828 Contact with and (suspected) exposure to other viral communicable diseases: Secondary | ICD-10-CM | POA: Diagnosis not present

## 2019-07-18 DIAGNOSIS — Z961 Presence of intraocular lens: Secondary | ICD-10-CM | POA: Diagnosis not present

## 2019-07-18 DIAGNOSIS — H43813 Vitreous degeneration, bilateral: Secondary | ICD-10-CM | POA: Diagnosis not present

## 2019-08-23 DIAGNOSIS — Z13220 Encounter for screening for lipoid disorders: Secondary | ICD-10-CM | POA: Diagnosis not present

## 2019-08-23 DIAGNOSIS — Z6826 Body mass index (BMI) 26.0-26.9, adult: Secondary | ICD-10-CM | POA: Diagnosis not present

## 2019-08-23 DIAGNOSIS — Z01419 Encounter for gynecological examination (general) (routine) without abnormal findings: Secondary | ICD-10-CM | POA: Diagnosis not present

## 2019-12-20 DIAGNOSIS — Z0000 Encounter for general adult medical examination without abnormal findings: Secondary | ICD-10-CM | POA: Diagnosis not present

## 2019-12-20 DIAGNOSIS — Z1159 Encounter for screening for other viral diseases: Secondary | ICD-10-CM | POA: Diagnosis not present

## 2019-12-20 DIAGNOSIS — Z13220 Encounter for screening for lipoid disorders: Secondary | ICD-10-CM | POA: Diagnosis not present

## 2020-01-03 ENCOUNTER — Other Ambulatory Visit: Payer: Self-pay | Admitting: Obstetrics and Gynecology

## 2020-01-03 DIAGNOSIS — Z1231 Encounter for screening mammogram for malignant neoplasm of breast: Secondary | ICD-10-CM

## 2020-02-11 DIAGNOSIS — Z20822 Contact with and (suspected) exposure to covid-19: Secondary | ICD-10-CM | POA: Diagnosis not present

## 2020-03-04 ENCOUNTER — Other Ambulatory Visit: Payer: Self-pay | Admitting: Obstetrics and Gynecology

## 2020-03-04 ENCOUNTER — Other Ambulatory Visit: Payer: Self-pay

## 2020-03-04 ENCOUNTER — Ambulatory Visit
Admission: RE | Admit: 2020-03-04 | Discharge: 2020-03-04 | Disposition: A | Discharge status: Home / Self Care | Payer: BC Managed Care – PPO | Source: Ambulatory Visit | Attending: Obstetrics and Gynecology | Admitting: Obstetrics and Gynecology

## 2020-03-04 DIAGNOSIS — Z1231 Encounter for screening mammogram for malignant neoplasm of breast: Secondary | ICD-10-CM

## 2020-07-22 DIAGNOSIS — Z961 Presence of intraocular lens: Secondary | ICD-10-CM | POA: Diagnosis not present

## 2020-07-22 DIAGNOSIS — H43813 Vitreous degeneration, bilateral: Secondary | ICD-10-CM | POA: Diagnosis not present

## 2020-07-22 DIAGNOSIS — H26493 Other secondary cataract, bilateral: Secondary | ICD-10-CM | POA: Diagnosis not present

## 2020-08-25 DIAGNOSIS — Z01419 Encounter for gynecological examination (general) (routine) without abnormal findings: Secondary | ICD-10-CM | POA: Diagnosis not present

## 2020-08-25 DIAGNOSIS — Z6828 Body mass index (BMI) 28.0-28.9, adult: Secondary | ICD-10-CM | POA: Diagnosis not present

## 2020-12-25 DIAGNOSIS — Z20822 Contact with and (suspected) exposure to covid-19: Secondary | ICD-10-CM | POA: Diagnosis not present

## 2021-02-11 ENCOUNTER — Other Ambulatory Visit: Payer: Self-pay | Admitting: Obstetrics and Gynecology

## 2021-02-11 DIAGNOSIS — Z1231 Encounter for screening mammogram for malignant neoplasm of breast: Secondary | ICD-10-CM

## 2021-03-10 DIAGNOSIS — Z0000 Encounter for general adult medical examination without abnormal findings: Secondary | ICD-10-CM | POA: Diagnosis not present

## 2021-03-10 DIAGNOSIS — Z23 Encounter for immunization: Secondary | ICD-10-CM | POA: Diagnosis not present

## 2021-03-10 DIAGNOSIS — Z8639 Personal history of other endocrine, nutritional and metabolic disease: Secondary | ICD-10-CM | POA: Diagnosis not present

## 2021-03-10 DIAGNOSIS — Z13220 Encounter for screening for lipoid disorders: Secondary | ICD-10-CM | POA: Diagnosis not present

## 2021-04-08 ENCOUNTER — Other Ambulatory Visit: Payer: Self-pay

## 2021-04-08 ENCOUNTER — Ambulatory Visit
Admission: RE | Admit: 2021-04-08 | Discharge: 2021-04-08 | Disposition: A | Discharge status: Home / Self Care | Payer: BC Managed Care – PPO | Source: Ambulatory Visit | Attending: Obstetrics and Gynecology | Admitting: Obstetrics and Gynecology

## 2021-04-08 DIAGNOSIS — Z1231 Encounter for screening mammogram for malignant neoplasm of breast: Secondary | ICD-10-CM | POA: Diagnosis not present

## 2021-07-16 DIAGNOSIS — Z23 Encounter for immunization: Secondary | ICD-10-CM | POA: Diagnosis not present

## 2021-08-18 DIAGNOSIS — Z961 Presence of intraocular lens: Secondary | ICD-10-CM | POA: Diagnosis not present

## 2021-08-18 DIAGNOSIS — H43813 Vitreous degeneration, bilateral: Secondary | ICD-10-CM | POA: Diagnosis not present

## 2021-08-18 DIAGNOSIS — H26493 Other secondary cataract, bilateral: Secondary | ICD-10-CM | POA: Diagnosis not present

## 2021-08-26 DIAGNOSIS — Z01419 Encounter for gynecological examination (general) (routine) without abnormal findings: Secondary | ICD-10-CM | POA: Diagnosis not present

## 2021-08-26 DIAGNOSIS — Z6828 Body mass index (BMI) 28.0-28.9, adult: Secondary | ICD-10-CM | POA: Diagnosis not present

## 2021-08-26 DIAGNOSIS — Z13820 Encounter for screening for osteoporosis: Secondary | ICD-10-CM | POA: Diagnosis not present

## 2021-09-15 DIAGNOSIS — I10 Essential (primary) hypertension: Secondary | ICD-10-CM | POA: Diagnosis not present

## 2021-09-29 DIAGNOSIS — I10 Essential (primary) hypertension: Secondary | ICD-10-CM | POA: Diagnosis not present

## 2021-12-28 DIAGNOSIS — H43813 Vitreous degeneration, bilateral: Secondary | ICD-10-CM | POA: Diagnosis not present

## 2021-12-28 DIAGNOSIS — H26493 Other secondary cataract, bilateral: Secondary | ICD-10-CM | POA: Diagnosis not present

## 2022-01-25 DIAGNOSIS — H26493 Other secondary cataract, bilateral: Secondary | ICD-10-CM | POA: Diagnosis not present

## 2022-02-15 ENCOUNTER — Other Ambulatory Visit: Payer: Self-pay | Admitting: Obstetrics and Gynecology

## 2022-02-15 DIAGNOSIS — Z1231 Encounter for screening mammogram for malignant neoplasm of breast: Secondary | ICD-10-CM

## 2022-04-22 ENCOUNTER — Ambulatory Visit: Payer: BC Managed Care – PPO

## 2022-05-17 ENCOUNTER — Ambulatory Visit
Admission: RE | Admit: 2022-05-17 | Discharge: 2022-05-17 | Disposition: A | Discharge status: Home / Self Care | Payer: BC Managed Care – PPO | Source: Ambulatory Visit | Attending: Obstetrics and Gynecology | Admitting: Obstetrics and Gynecology

## 2022-05-17 DIAGNOSIS — Z1231 Encounter for screening mammogram for malignant neoplasm of breast: Secondary | ICD-10-CM | POA: Diagnosis not present

## 2022-05-21 DIAGNOSIS — Z13220 Encounter for screening for lipoid disorders: Secondary | ICD-10-CM | POA: Diagnosis not present

## 2022-05-21 DIAGNOSIS — I10 Essential (primary) hypertension: Secondary | ICD-10-CM | POA: Diagnosis not present

## 2022-05-21 DIAGNOSIS — Z0000 Encounter for general adult medical examination without abnormal findings: Secondary | ICD-10-CM | POA: Diagnosis not present

## 2022-05-21 DIAGNOSIS — Z8639 Personal history of other endocrine, nutritional and metabolic disease: Secondary | ICD-10-CM | POA: Diagnosis not present

## 2022-08-02 DIAGNOSIS — R0981 Nasal congestion: Secondary | ICD-10-CM | POA: Diagnosis not present

## 2022-08-02 DIAGNOSIS — H6122 Impacted cerumen, left ear: Secondary | ICD-10-CM | POA: Diagnosis not present

## 2022-08-02 DIAGNOSIS — H938X2 Other specified disorders of left ear: Secondary | ICD-10-CM | POA: Diagnosis not present

## 2022-08-13 ENCOUNTER — Other Ambulatory Visit (HOSPITAL_COMMUNITY): Payer: Self-pay | Admitting: Physician Assistant

## 2022-08-13 ENCOUNTER — Other Ambulatory Visit: Payer: Self-pay | Admitting: Physician Assistant

## 2022-08-13 DIAGNOSIS — I739 Peripheral vascular disease, unspecified: Secondary | ICD-10-CM

## 2022-08-13 DIAGNOSIS — M79662 Pain in left lower leg: Secondary | ICD-10-CM

## 2022-08-18 ENCOUNTER — Ambulatory Visit (HOSPITAL_COMMUNITY)
Admission: RE | Admit: 2022-08-18 | Discharge: 2022-08-18 | Disposition: A | Discharge status: Home / Self Care | Payer: BC Managed Care – PPO | Source: Ambulatory Visit | Attending: Physician Assistant | Admitting: Physician Assistant

## 2022-08-18 DIAGNOSIS — I739 Peripheral vascular disease, unspecified: Secondary | ICD-10-CM | POA: Diagnosis not present

## 2022-08-18 NOTE — Progress Notes (Signed)
[name] has been completed.   Preliminary results in CV Proc.   Morgan Leblanc [name] 08/18/2022 10:08 AM

## 2022-08-24 ENCOUNTER — Encounter: Payer: Self-pay | Admitting: Vascular Surgery

## 2022-08-24 ENCOUNTER — Ambulatory Visit (INDEPENDENT_AMBULATORY_CARE_PROVIDER_SITE_OTHER): Payer: BC Managed Care – PPO | Admitting: Vascular Surgery

## 2022-08-24 VITALS — BP 149/81 | HR 68 | Temp 97.3°F | Resp 14 | Ht 65.0 in | Wt 165.0 lb

## 2022-08-24 DIAGNOSIS — I739 Peripheral vascular disease, unspecified: Secondary | ICD-10-CM

## 2022-08-24 MED ORDER — CILOSTAZOL 100 MG PO TABS: 100.0000 mg | ORAL_TABLET | Freq: Two times a day (BID) | ORAL | 11 refills | Status: DC

## 2022-08-24 MED ORDER — ATORVASTATIN CALCIUM 10 MG PO TABS: 10.0000 mg | ORAL_TABLET | Freq: Every day | ORAL | 11 refills | Status: DC

## 2022-08-24 NOTE — Progress Notes (Addendum)
Patient name: Morgan Leblanc MRN: [id number] DOB: 11/14/1960 Sex: female  REASON FOR CONSULT: Evaluate leg pain, abnormal [name]  HPI: Morgan Leblanc is a 61 y.o. female, with history of hyperlipidemia that presents for evaluation of left leg pain.  This started several months ago.  After she walks about 1/10 of a mile she gets cramping in her left calf.  She does not feel it is pain but it is definitive cramp.  She usually can walk up to several miles but has been more limited recently due to her calf discomfort.  She felt this would eventually go away but it has not.  No rest pain at night.  No wounds to suggest critical limb ischemia.  No history of tobacco abuse.  She had ABIs on 08/18/2022 that were 1.07 on the right triphasic with a normal toe pressure.  They were 0.99 on the left triphasic with an abnormal toe pressure.  No previous instrumentation of her left groin or other intervention on her arteries in the past.  History reviewed. No pertinent past medical history.  Past Surgical History:  Procedure Laterality Date   c section x 2      Family History  Problem Relation Age of Onset   Breast cancer Mother    Cancer Other     SOCIAL HISTORY: Social History   Socioeconomic History   Marital status: Married    Spouse name: Not on file   Number of children: Not on file   Years of education: Not on file   Highest education level: Not on file  Occupational History   Not on file  Tobacco Use   Smoking status: Never   Smokeless tobacco: Not on file  Substance and Sexual Activity   Alcohol use: No   Drug use: No   Sexual activity: Not on file  Other Topics Concern   Not on file  Social History Narrative   Not on file   Social Determinants of Health   Financial Resource Strain: Not on file  Food Insecurity: Not on file  Transportation Needs: Not on file  Physical Activity: Not on file  Stress: Not on file  Social Connections: Not on file  Intimate Partner Violence: Not  on file    No Known Allergies  Current Outpatient Medications  Medication Sig Dispense Refill   Cholecalciferol (VITAMIN D3) 1000 units CAPS      lisinopril (ZESTRIL) 5 MG tablet Take 1 tablet by mouth daily.     Multiple Vitamin (MULTIVITAMIN WITH MINERALS) TABS tablet Take 1 tablet by mouth daily.     calcium carbonate (OS-CAL - DOSED IN MG OF ELEMENTAL CALCIUM) 1250 MG tablet Take 1 tablet by mouth daily with breakfast.     ciprofloxacin (CIPRO) 500 MG tablet Take 1 tablet (500 mg total) by mouth 2 (two) times daily. 20 tablet 0   No current facility-administered medications for this visit.    REVIEW OF SYSTEMS:  [X]  denotes positive finding, [ ]  denotes negative finding Cardiac  Comments:  Chest pain or chest pressure:    Shortness of breath upon exertion:    Short of breath when lying flat:    Irregular heart rhythm:        Vascular    Pain in calf, thigh, or hip brought on by ambulation: x Left leg  Pain in feet at night that wakes you up from your sleep:     Blood clot in your veins:    Leg swelling:  Pulmonary    Oxygen at home:    Productive cough:     Wheezing:         Neurologic    Sudden weakness in arms or legs:     Sudden numbness in arms or legs:     Sudden onset of difficulty speaking or slurred speech:    Temporary loss of vision in one eye:     Problems with dizziness:         Gastrointestinal    Blood in stool:     Vomited blood:         Genitourinary    Burning when urinating:     Blood in urine:        Psychiatric    Major depression:         Hematologic    Bleeding problems:    Problems with blood clotting too easily:        Skin    Rashes or ulcers:        Constitutional    Fever or chills:      PHYSICAL EXAM: Vitals:   08/24/22 1537  BP: (!) 149/81  Pulse: 68  Resp: 14  Temp: (!) 97.3 F (36.3 C)  TempSrc: Temporal  SpO2: 98%  Weight: 165 lb (74.8 kg)  Height: 5\' 5"  (1.651 m)    GENERAL: The patient is a  well-nourished female, in no acute distress. The vital signs are documented above. CARDIAC: There is a regular rate and rhythm.  VASCULAR:  Palpable femoral pulses bilaterally Palpable dorsalis pedis and posterior tibial pulses bilaterally PULMONARY: No respiratory distress. ABDOMEN: Soft and non-tender. MUSCULOSKELETAL: There are no major deformities or cyanosis. NEUROLOGIC: No focal weakness or paresthesias are detected. SKIN: There are no ulcers or rashes noted. PSYCHIATRIC: The patient has a normal affect.  DATA:   [name]'s 08/18/22: 1.07 right triphasic and 0.99 left triphasic - no significant lower extremity arterial disease   Assessment/Plan:  61 year old female presents for evaluation of left leg claudication that started several months ago.  This starts after she walks about 1/10 of a mile and she has distinct discomfort in the left calf that she describes as cramping.  On exam she does have palpable dorsalis pedis pulse.  She does have a flat toe tracing on the left with abnormal TBI.  Discussed her presentation is suggestive of vascular claudication although she does not significant risk factors other than HLD.  I discussed managing claudication with conservative measures until it becomes lifestyle limiting.  I discussed walking therapies with her as well as putting her on Pletal to see if this help.  I have also started her on a low-dose statin.  I will see her in 3 months to see her progress.  Discussed if worsening symptoms in the future and no improvement we can evaluate for an arteriogram in the future but again with her symptoms starting after walking 1/10 of a mile and then continuing to walk 2 miles, I would be very conservative at this point.   48, MD [facility] Office: [phone]

## 2022-09-23 DIAGNOSIS — Z01419 Encounter for gynecological examination (general) (routine) without abnormal findings: Secondary | ICD-10-CM | POA: Diagnosis not present

## 2022-09-23 DIAGNOSIS — Z6827 Body mass index (BMI) 27.0-27.9, adult: Secondary | ICD-10-CM | POA: Diagnosis not present

## 2022-11-08 DIAGNOSIS — L03011 Cellulitis of right finger: Secondary | ICD-10-CM | POA: Diagnosis not present

## 2022-11-10 ENCOUNTER — Ambulatory Visit
Admission: EM | Admit: 2022-11-10 | Discharge: 2022-11-10 | Disposition: A | Discharge status: Home / Self Care | Payer: BC Managed Care – PPO

## 2022-11-10 DIAGNOSIS — L03011 Cellulitis of right finger: Secondary | ICD-10-CM

## 2022-11-10 HISTORY — DX: Peripheral vascular disease, unspecified: I73.9

## 2022-11-10 NOTE — ED Triage Notes (Signed)
Pt c/o redness to right middle finger nailbed  since 3/2-seen by PCP 2/4-started on abx-was to f/u today due to pt feels area is worse-unable to get appt-NAD-steady gait

## 2022-11-10 NOTE — ED Provider Notes (Signed)
[facility]  Note:  This document was prepared using Systems analyst and may include unintentional dictation errors.  MRN: [id number] DOB: [birth year]  Subjective:   Morgan Leblanc is a 62 y.o. female presenting for 5-day history of acute onset persistent right middle finger pain about the nail.  Was seen 10/10/2022 and started on doxycycline for paronychia.  An incision and drainage was not performed.  She has gotten worse.  No current facility-administered medications for this encounter.  Current Outpatient Medications:  .  aspirin EC 81 MG tablet, Take 81 mg by mouth daily. Swallow whole., Disp: , Rfl:  .  atorvastatin (LIPITOR) 10 MG tablet, Take 10 mg by mouth daily., Disp: , Rfl:  .  atorvastatin (LIPITOR) 10 MG tablet, Take 1 tablet (10 mg total) by mouth daily., Disp: 30 tablet, Rfl: 11 .  calcium carbonate (OS-CAL - DOSED IN MG OF ELEMENTAL CALCIUM) 1250 MG tablet, Take 1 tablet by mouth daily with breakfast., Disp: , Rfl:  .  Cholecalciferol (VITAMIN D3) 1000 units CAPS, , Disp: , Rfl:  .  cilostazol (PLETAL) 100 MG tablet, Take 1 tablet (100 mg total) by mouth 2 (two) times daily before a meal., Disp: 60 tablet, Rfl: 11 .  ciprofloxacin (CIPRO) 500 MG tablet, Take 1 tablet (500 mg total) by mouth 2 (two) times daily., Disp: 20 tablet, Rfl: 0 .  lisinopril (ZESTRIL) 5 MG tablet, Take 1 tablet by mouth daily., Disp: , Rfl:  .  Multiple Vitamin (MULTIVITAMIN WITH MINERALS) TABS tablet, Take 1 tablet by mouth daily., Disp: , Rfl:    No Known Allergies  Past Medical History:  Diagnosis Date  . Claudication of left lower extremity [facility])      Past Surgical History:  Procedure Laterality Date  . c section x 2      Family History  Problem Relation Age of Onset  . Breast cancer Mother   . Cancer Other     Social History   Tobacco Use  . Smoking status: Never  . Smokeless tobacco: Never  Vaping Use  . Vaping Use: Never used   Substance Use Topics  . Alcohol use: No  . Drug use: No    ROS   Objective:   Vitals: BP (!) 142/94 (BP Location: Right Arm)   Pulse 62   Temp 98.2 F (36.8 C) (Oral)   Resp 16   SpO2 97%   Physical Exam Constitutional:      General: She is not in acute distress.    Appearance: Normal appearance. She is well-developed. She is not ill-appearing, toxic-appearing or diaphoretic.  HENT:     Head: Normocephalic and atraumatic.     Nose: Nose normal.     Mouth/Throat:     Mouth: Mucous membranes are moist.  Eyes:     General: No scleral icterus.       Right eye: No discharge.        Left eye: No discharge.     Extraocular Movements: Extraocular movements intact.  Cardiovascular:     Rate and Rhythm: Normal rate.  Pulmonary:     Effort: Pulmonary effort is normal.  Musculoskeletal:       Hands:  Skin:    General: Skin is warm and dry.  Neurological:     General: No focal deficit present.     Mental Status: She is alert and oriented to person, place, and time.  Psychiatric:  Mood and Affect: Mood normal.        Behavior: Behavior normal.   PROCEDURE NOTE: Paronychia I&D Verbal consent obtained. Local anesthesia/digital block with 3cc of 2% lidocaine without lidocaine. Site cleansed with Betadine.  Paronychia expressed using Adson forcep, discharge 2cc mixture of purulence, serosanguineous fluid. Cleansed and dressed.  Assessment and Plan :   PDMP not reviewed this encounter.  1. Paronychia of right middle finger     Successful I&D performed.  Wound care reviewed.  Continue doxycycline for the paronychia, ibuprofen for pain and inflammation. Counseled patient on potential for adverse effects with medications prescribed/recommended today, ER and return-to-clinic precautions discussed, patient verbalized understanding.    Jaynee Eagles, [place] 11/11/22 [phone]

## 2022-11-10 NOTE — Discharge Instructions (Addendum)
Please change your dressing 3-5 times daily. Do not apply any ointments or creams. Each time you change your dressing, make sure that you are pressing on the wound to get pus to come out.  Try your best to clean the wound with antibacterial soap and warm water. Pat your wound dry and let it air out if possible to make sure it is dry before reapplying another dressing.   Keep taking doxycycline for the infection. Use ibuprofen for the pain.

## 2022-11-19 ENCOUNTER — Other Ambulatory Visit: Payer: Self-pay | Admitting: Family Medicine

## 2022-11-19 DIAGNOSIS — I10 Essential (primary) hypertension: Secondary | ICD-10-CM | POA: Diagnosis not present

## 2022-11-19 DIAGNOSIS — E7849 Other hyperlipidemia: Secondary | ICD-10-CM | POA: Diagnosis not present

## 2022-11-19 DIAGNOSIS — Z136 Encounter for screening for cardiovascular disorders: Secondary | ICD-10-CM

## 2022-11-19 DIAGNOSIS — I739 Peripheral vascular disease, unspecified: Secondary | ICD-10-CM | POA: Diagnosis not present

## 2022-11-23 ENCOUNTER — Encounter: Payer: Self-pay | Admitting: Vascular Surgery

## 2022-11-23 ENCOUNTER — Ambulatory Visit (INDEPENDENT_AMBULATORY_CARE_PROVIDER_SITE_OTHER): Payer: BC Managed Care – PPO | Admitting: Vascular Surgery

## 2022-11-23 VITALS — BP 116/77 | HR 62 | Temp 97.3°F | Resp 14 | Ht 65.5 in | Wt 162.0 lb

## 2022-11-23 DIAGNOSIS — I739 Peripheral vascular disease, unspecified: Secondary | ICD-10-CM

## 2022-11-23 NOTE — Progress Notes (Signed)
Patient name: Morgan Leblanc MRN: [id number] DOB: 1960-12-22 Sex: female  REASON FOR CONSULT: 3 month follow-up, left leg claudication  HPI: Morgan Leblanc is a 62 y.o. female, with history of hyperlipidemia that presents for 3 month follow-up of her left leg pain that was felt to be consistent with vascular claudication.  She previously could walk about 1/10 of a mile when she got cramping in her left calf.   She had ABIs on 08/18/2022 that were 1.07 on the right triphasic with a normal toe pressure.  They were 0.99 on the left triphasic with an abnormal toe pressure.   We previously elected to manage her conservatively.  She could not tolerate the Pletal due to rapid heart rate and this was stopped.  She has remained on aspirin and statin.  Still gets cramping in her left calf after about 1/10 of a mile.  States she can walk up to 2 miles without stopping.  Usually does this 4 times a week.  This is worse when walking up an incline.  Past Medical History:  Diagnosis Date   Claudication of left lower extremity ([place])     Past Surgical History:  Procedure Laterality Date   c section x 2      Family History  Problem Relation Age of Onset   Breast cancer Mother    Cancer Other     SOCIAL HISTORY: Social History   Socioeconomic History   Marital status: Married    Spouse name: Not on file   Number of children: Not on file   Years of education: Not on file   Highest education level: Not on file  Occupational History   Not on file  Tobacco Use   Smoking status: Never   Smokeless tobacco: Never  Vaping Use   Vaping Use: Never used  Substance and Sexual Activity   Alcohol use: No   Drug use: No   Sexual activity: Not on file  Other Topics Concern   Not on file  Social History Narrative   Not on file   Social Determinants of Health   Financial Resource Strain: Not on file  Food Insecurity: Not on file  Transportation Needs: Not on file  Physical Activity: Not on file   Stress: Not on file  Social Connections: Not on file  Intimate Partner Violence: Not on file    No Known Allergies  Current Outpatient Medications  Medication Sig Dispense Refill   aspirin EC 81 MG tablet Take 81 mg by mouth daily. Swallow whole.     atorvastatin (LIPITOR) 10 MG tablet Take 1 tablet (10 mg total) by mouth daily. 30 tablet 11   Cholecalciferol (VITAMIN D3) 1000 units CAPS      lisinopril (ZESTRIL) 5 MG tablet Take 1 tablet by mouth daily.     Multiple Vitamin (MULTIVITAMIN WITH MINERALS) TABS tablet Take 1 tablet by mouth daily.     atorvastatin (LIPITOR) 10 MG tablet Take 10 mg by mouth daily.     calcium carbonate (OS-CAL - DOSED IN MG OF ELEMENTAL CALCIUM) 1250 MG tablet Take 1 tablet by mouth daily with breakfast.     cilostazol (PLETAL) 100 MG tablet Take 1 tablet (100 mg total) by mouth 2 (two) times daily before a meal. 60 tablet 11   ciprofloxacin (CIPRO) 500 MG tablet Take 1 tablet (500 mg total) by mouth 2 (two) times daily. 20 tablet 0   No current facility-administered medications for this visit.  REVIEW OF SYSTEMS:  [X]  denotes positive finding, [ ]  denotes negative finding Cardiac  Comments:  Chest pain or chest pressure:    Shortness of breath upon exertion:    Short of breath when lying flat:    Irregular heart rhythm:        Vascular    Pain in calf, thigh, or hip brought on by ambulation: x Left leg  Pain in feet at night that wakes you up from your sleep:     Blood clot in your veins:    Leg swelling:         Pulmonary    Oxygen at home:    Productive cough:     Wheezing:         Neurologic    Sudden weakness in arms or legs:     Sudden numbness in arms or legs:     Sudden onset of difficulty speaking or slurred speech:    Temporary loss of vision in one eye:     Problems with dizziness:         Gastrointestinal    Blood in stool:     Vomited blood:         Genitourinary    Burning when urinating:     Blood in urine:         Psychiatric    Major depression:         Hematologic    Bleeding problems:    Problems with blood clotting too easily:        Skin    Rashes or ulcers:        Constitutional    Fever or chills:      PHYSICAL EXAM: Vitals:   11/23/22 1601  BP: 116/77  Pulse: 62  Resp: 14  Temp: (!) 97.3 F (36.3 C)  TempSrc: Temporal  SpO2: 99%  Weight: 162 lb (73.5 kg)  Height: 5' 5.5" (1.664 m)    GENERAL: The patient is a well-nourished female, in no acute distress. The vital signs are documented above. CARDIAC: There is a regular rate and rhythm.  VASCULAR:  Palpable femoral pulses bilaterally Right DP 2+ palpable Left DP 1+ palpable PULMONARY: No respiratory distress. ABDOMEN: Soft and non-tender. MUSCULOSKELETAL: There are no major deformities or cyanosis. NEUROLOGIC: No focal weakness or paresthesias are detected. PSYCHIATRIC: The patient has a normal affect.  DATA:   [name]'s 08/18/22: 1.07 right triphasic and 0.99 left triphasic - no significant lower extremity arterial disease   Assessment/Plan:  62 year old female presents for 44-month follow-up of her left lower extremity claudication.  She still has a very classic description of claudication that starts after 1/10 of a mile with cramping in the left calf.  This is worse when walking up an incline.  She is usually able to walk through it and can go up to 2 miles when she is exercising.  I again discussed managing claudication with lifestyle modifications and not surgical intervention until it becomes lifestyle limiting.  Unfortunately she could not tolerate the Pletal and this was stopped.  She remains on aspirin and statin for risk reduction.  Overall pleased with her progress.  I again enforced the concept of walking therapies.  I will see her in 1 year with repeat ABIs.  I discussed she call me if she has any worsening symptoms before then.   Marty Heck, MD [facility] Office:  [phone]

## 2022-12-22 ENCOUNTER — Ambulatory Visit
Admission: RE | Admit: 2022-12-22 | Discharge: 2022-12-22 | Disposition: A | Discharge status: Home / Self Care | Payer: No Typology Code available for payment source | Source: Ambulatory Visit | Attending: Family Medicine | Admitting: Family Medicine

## 2022-12-22 DIAGNOSIS — Z136 Encounter for screening for cardiovascular disorders: Secondary | ICD-10-CM

## 2023-01-13 ENCOUNTER — Encounter: Payer: Self-pay | Admitting: Cardiology

## 2023-01-13 ENCOUNTER — Ambulatory Visit: Payer: BC Managed Care – PPO | Admitting: Cardiology

## 2023-01-13 VITALS — BP 121/80 | HR 75 | Ht 65.5 in | Wt 159.8 lb

## 2023-01-13 DIAGNOSIS — I739 Peripheral vascular disease, unspecified: Secondary | ICD-10-CM | POA: Diagnosis not present

## 2023-01-13 DIAGNOSIS — E782 Mixed hyperlipidemia: Secondary | ICD-10-CM | POA: Diagnosis not present

## 2023-01-13 MED ORDER — ATORVASTATIN CALCIUM 20 MG PO TABS: 20.0000 mg | ORAL_TABLET | Freq: Every day | ORAL | 3 refills | Status: AC

## 2023-01-13 NOTE — Progress Notes (Signed)
Patient referred by Laurann Montana, MD for elevated calcium score  Subjective:   Morgan Leblanc, female    DOB: 1960/12/20, 62 y.o.   MRN: [id number]   Chief Complaint  Patient presents with   PAD   New Patient (Initial Visit)   Claudication in peripheral vascular disease     HPI  62 y.o. Caucasian female with PAD, elevated calcium score.  Patient is a retired Engineer, civil (consulting).  She stays active with 2 to 3 miles walk regularly without recurrence of chest pain or shortness of breath.  She has mild claudication symptoms but able to walk about distances without any limiting claudication.  Reviewed recent CT cardiac scoring scan with the patient, details below.  Past Medical History:  Diagnosis Date   Claudication of left lower extremity (HCC)      Past Surgical History:  Procedure Laterality Date   c section x 2       Social History   Tobacco Use  Smoking Status Never  Smokeless Tobacco Never    Social History   Substance and Sexual Activity  Alcohol Use No     Family History  Problem Relation Age of Onset   Breast cancer Mother    Cancer Other       Current Outpatient Medications:    aspirin EC 81 MG tablet, Take 81 mg by mouth daily. Swallow whole., Disp: , Rfl:    atorvastatin (LIPITOR) 10 MG tablet, Take 1 tablet (10 mg total) by mouth daily., Disp: 30 tablet, Rfl: 11   cetirizine (ZYRTEC) 10 MG tablet, Take 10 mg by mouth daily., Disp: , Rfl:    Cholecalciferol (VITAMIN D3) 1000 units CAPS, Take 2,000 Units by mouth daily., Disp: , Rfl:    fluticasone (FLONASE) 50 MCG/ACT nasal spray, Place 1 spray into both nostrils daily., Disp: , Rfl:    lisinopril (ZESTRIL) 5 MG tablet, Take 1 tablet by mouth daily., Disp: , Rfl:    Multiple Vitamin (MULTIVITAMIN WITH MINERALS) TABS tablet, Take 1 tablet by mouth daily., Disp: , Rfl:    Cardiovascular and other pertinent studies:  Reviewed external labs and tests, independently interpreted  EKG 01/13/2023: Sinus  rhythm 63 bpm  Normal EKG   CT cardiac scoring 12/22/2022: LM: 0 LAD: 33.4 LCx: 0 RCA: 0.7   Total Agatston Score: 34.1 MESA database percentile: 82  3 mm subpleural left basilar pulmonary nodule. No follow-up needed if patient is low-risk.   Recent labs: 11/19/2022: Chol 162, TG 166, HDL 59, LDL 75  05/21/2022: Glucose 89, BUN/Cr 15/0.85. EGFR 79. Na/K 140/4.2. Rest of the CMP normal Chol 260, TG 205, HDL 61, LDL 162   05/2022: H/H 13/40. MCV 90. Platelets 241 TSH 2.4 normal   Review of Systems  Cardiovascular:  Positive for claudication. Negative for chest pain, dyspnea on exertion, leg swelling, palpitations and syncope.         Vitals:   01/13/23 1418  BP: 121/80  Pulse: 75  SpO2: 100%     Body mass index is 26.19 kg/m. Filed Weights   01/13/23 1418  Weight: 159 lb 12.8 oz (72.5 kg)     Objective:   Physical Exam Vitals and nursing note reviewed.  Constitutional:      General: She is not in acute distress. Neck:     Vascular: No JVD.  Cardiovascular:     Rate and Rhythm: Normal rate and regular rhythm.     Pulses:  Dorsalis pedis pulses are 2+ on the right side and 1+ on the left side.       Posterior tibial pulses are 1+ on the right side and 1+ on the left side.     Heart sounds: Normal heart sounds. No murmur heard. Pulmonary:     Effort: Pulmonary effort is normal.     Breath sounds: Normal breath sounds. No wheezing or rales.  Musculoskeletal:     Right lower leg: No edema.     Left lower leg: No edema.           Visit diagnoses:   ICD-10-CM   1. Claudication in peripheral vascular disease (HCC)  I73.9 EKG 12-Lead    2. Mixed hyperlipidemia  E78.2        Orders Placed This Encounter  Procedures   EKG 12-Lead     Medication changes this visit: Medications Discontinued During This Encounter  Medication Reason   atorvastatin (LIPITOR) 10 MG tablet Completed Course   ciprofloxacin (CIPRO) 500 MG tablet Completed  Course   cilostazol (PLETAL) 100 MG tablet Completed Course   calcium carbonate (OS-CAL - DOSED IN MG OF ELEMENTAL CALCIUM) 1250 MG tablet Completed Course   atorvastatin (LIPITOR) 10 MG tablet Reorder    Meds ordered this encounter  Medications   atorvastatin (LIPITOR) 20 MG tablet    Sig: Take 1 tablet (20 mg total) by mouth daily.    Dispense:  90 tablet    Refill:  3     Assessment & Recommendations:    62 y.o. Caucasian female with PAD, elevated calcium score.  Elevated coronary calcium score: 82nd percentile, none in left main. LDL down from 162 to 75 with Crestor 10 mg daily. Increase Crestor to 20 mg daily to further maximize LDL reduction.  PAD: Nonlifestyle limiting claudication. Continue aspirin 81 mg daily, along with statin. Continue heart healthy diet and exercise.   Thank you for referring the patient to [place]. Please feel free to contact with any questions.   Elder Negus, MD Pager: [phone] Office: [phone]

## 2023-01-14 ENCOUNTER — Encounter: Payer: Self-pay | Admitting: Cardiology

## 2023-02-18 DIAGNOSIS — H524 Presbyopia: Secondary | ICD-10-CM | POA: Diagnosis not present

## 2023-02-18 DIAGNOSIS — Z961 Presence of intraocular lens: Secondary | ICD-10-CM | POA: Diagnosis not present

## 2023-02-18 DIAGNOSIS — H43813 Vitreous degeneration, bilateral: Secondary | ICD-10-CM | POA: Diagnosis not present

## 2023-02-18 DIAGNOSIS — H26493 Other secondary cataract, bilateral: Secondary | ICD-10-CM | POA: Diagnosis not present

## 2023-02-18 DIAGNOSIS — H52223 Regular astigmatism, bilateral: Secondary | ICD-10-CM | POA: Diagnosis not present

## 2023-04-08 ENCOUNTER — Other Ambulatory Visit: Payer: Self-pay | Admitting: Obstetrics and Gynecology

## 2023-04-08 DIAGNOSIS — Z1231 Encounter for screening mammogram for malignant neoplasm of breast: Secondary | ICD-10-CM

## 2023-05-26 ENCOUNTER — Ambulatory Visit
Admission: RE | Admit: 2023-05-26 | Discharge: 2023-05-26 | Disposition: A | Discharge status: Home / Self Care | Payer: BC Managed Care – PPO | Source: Ambulatory Visit | Attending: Obstetrics and Gynecology | Admitting: Obstetrics and Gynecology

## 2023-05-26 ENCOUNTER — Ambulatory Visit: Payer: BC Managed Care – PPO

## 2023-05-26 DIAGNOSIS — Z1231 Encounter for screening mammogram for malignant neoplasm of breast: Secondary | ICD-10-CM | POA: Diagnosis not present

## 2023-06-03 DIAGNOSIS — I10 Essential (primary) hypertension: Secondary | ICD-10-CM | POA: Diagnosis not present

## 2023-06-03 DIAGNOSIS — Z8639 Personal history of other endocrine, nutritional and metabolic disease: Secondary | ICD-10-CM | POA: Diagnosis not present

## 2023-06-03 DIAGNOSIS — Z23 Encounter for immunization: Secondary | ICD-10-CM | POA: Diagnosis not present

## 2023-06-03 DIAGNOSIS — I739 Peripheral vascular disease, unspecified: Secondary | ICD-10-CM | POA: Diagnosis not present

## 2023-06-03 DIAGNOSIS — Z0000 Encounter for general adult medical examination without abnormal findings: Secondary | ICD-10-CM | POA: Diagnosis not present

## 2023-06-03 DIAGNOSIS — E7849 Other hyperlipidemia: Secondary | ICD-10-CM | POA: Diagnosis not present

## 2023-07-25 DIAGNOSIS — M792 Neuralgia and neuritis, unspecified: Secondary | ICD-10-CM | POA: Diagnosis not present

## 2023-07-25 DIAGNOSIS — L565 Disseminated superficial actinic porokeratosis (DSAP): Secondary | ICD-10-CM | POA: Diagnosis not present

## 2023-07-25 DIAGNOSIS — M205X2 Other deformities of toe(s) (acquired), left foot: Secondary | ICD-10-CM | POA: Diagnosis not present

## 2023-09-28 DIAGNOSIS — Z01419 Encounter for gynecological examination (general) (routine) without abnormal findings: Secondary | ICD-10-CM | POA: Diagnosis not present

## 2023-09-28 DIAGNOSIS — Z124 Encounter for screening for malignant neoplasm of cervix: Secondary | ICD-10-CM | POA: Diagnosis not present

## 2023-09-28 DIAGNOSIS — Z1151 Encounter for screening for human papillomavirus (HPV): Secondary | ICD-10-CM | POA: Diagnosis not present

## 2023-09-28 DIAGNOSIS — D649 Anemia, unspecified: Secondary | ICD-10-CM | POA: Diagnosis not present

## 2023-09-28 DIAGNOSIS — Z6825 Body mass index (BMI) 25.0-25.9, adult: Secondary | ICD-10-CM | POA: Diagnosis not present

## 2023-11-18 ENCOUNTER — Other Ambulatory Visit: Payer: Self-pay | Admitting: *Deleted

## 2023-11-18 DIAGNOSIS — I739 Peripheral vascular disease, unspecified: Secondary | ICD-10-CM

## 2023-11-28 NOTE — Progress Notes (Unsigned)
 Patient name: Morgan Leblanc MRN: [id number] DOB: September 03, 1961 Sex: female  REASON FOR CONSULT: 1 year follow-up, left leg claudication  HPI: Morgan Leblanc is a 63 y.o. female, with history of hyperlipidemia that presents for 1 year follow-up of her left leg pain that was felt to be consistent with vascular claudication.  She previously could walk about 1/10 of a mile when she got cramping in her left calf.   She had ABIs on 08/18/2022 that were 1.07 on the right triphasic with a normal toe pressure.  They were 0.99 on the left triphasic with an abnormal toe pressure.   We previously elected to manage her conservatively.  She could not tolerate the Pletal due to rapid heart rate and this was stopped.  She has remained on aspirin and statin.    Today she states she thinks she can walk a block and a half before her left calf cramps and then usually keeps walking more.  No worse.  Able to do most of her activities.  No rest pain or tissue loss.  Past Medical History:  Diagnosis Date   Claudication of left lower extremity (HCC)    PAD (peripheral artery disease) (HCC)     Past Surgical History:  Procedure Laterality Date   c section x 2     CATARACT EXTRACTION Bilateral     Family History  Problem Relation Age of Onset   Breast cancer Mother    Hypertension Father    Prostate cancer Father    Heart attack Maternal Grandfather     SOCIAL HISTORY: Social History   Socioeconomic History   Marital status: Married    Spouse name: Not on file   Number of children: Not on file   Years of education: Not on file   Highest education level: Not on file  Occupational History   Not on file  Tobacco Use   Smoking status: Never   Smokeless tobacco: Never  Vaping Use   Vaping status: Never Used  Substance and Sexual Activity   Alcohol use: No   Drug use: No   Sexual activity: Not on file  Other Topics Concern   Not on file  Social History Narrative   Not on file   Social Drivers of  Health   Financial Resource Strain: Not on file  Food Insecurity: Not on file  Transportation Needs: Not on file  Physical Activity: Not on file  Stress: Not on file  Social Connections: Not on file  Intimate Partner Violence: Not on file    Allergies  Allergen Reactions   Cilostazol Palpitations    Palps/headaches   Latex Rash    Current Outpatient Medications  Medication Sig Dispense Refill   aspirin EC 81 MG tablet Take 81 mg by mouth daily. Swallow whole.     atorvastatin (LIPITOR) 20 MG tablet Take 1 tablet (20 mg total) by mouth daily. 90 tablet 3   cetirizine (ZYRTEC) 10 MG tablet Take 10 mg by mouth daily.     Cholecalciferol (VITAMIN D3) 1000 units CAPS Take 2,000 Units by mouth daily.     fluticasone (FLONASE) 50 MCG/ACT nasal spray Place 1 spray into both nostrils daily.     lisinopril (ZESTRIL) 5 MG tablet Take 1 tablet by mouth daily.     Multiple Vitamin (MULTIVITAMIN WITH MINERALS) TABS tablet Take 1 tablet by mouth daily.     No current facility-administered medications for this visit.    REVIEW OF SYSTEMS:  [X]   denotes positive finding, [ ]  denotes negative finding Cardiac  Comments:  Chest pain or chest pressure:    Shortness of breath upon exertion:    Short of breath when lying flat:    Irregular heart rhythm:        Vascular    Pain in calf, thigh, or hip brought on by ambulation: x Left leg  Pain in feet at night that wakes you up from your sleep:     Blood clot in your veins:    Leg swelling:         Pulmonary    Oxygen at home:    Productive cough:     Wheezing:         Neurologic    Sudden weakness in arms or legs:     Sudden numbness in arms or legs:     Sudden onset of difficulty speaking or slurred speech:    Temporary loss of vision in one eye:     Problems with dizziness:         Gastrointestinal    Blood in stool:     Vomited blood:         Genitourinary    Burning when urinating:     Blood in urine:        Psychiatric     Major depression:         Hematologic    Bleeding problems:    Problems with blood clotting too easily:        Skin    Rashes or ulcers:        Constitutional    Fever or chills:      PHYSICAL EXAM: There were no vitals filed for this visit.   GENERAL: The patient is a well-nourished female, in no acute distress. The vital signs are documented above. CARDIAC: There is a regular rate and rhythm.  VASCULAR:  Palpable femoral pulses bilaterally Right DP 2+ palpable No left pedal pulses palpable, foot warm PULMONARY: No respiratory distress. ABDOMEN: Soft and non-tender. MUSCULOSKELETAL: There are no major deformities or cyanosis. NEUROLOGIC: No focal weakness or paresthesias are detected. PSYCHIATRIC: The patient has a normal affect.  DATA:   [name] today 1.13 right triphasic and 0.65 left monophasic    Assessment/Plan:  63 year old female presents for 1 year follow-up of her left lower extremity claudication.  Still endorsing left calf claudication usually after walking 1.5-blocks.  This is no worse and symptoms are stable over the past year.  I discussed we again manage claudication with lifestyle modification and nonsurgical intervention until it becomes disabling.  Given she can do all of her activities of daily living we will continue with conservative approach.  She is on aspirin statin for risk reduction.  Given she did have a drop in her ABIs I will see her in 6 months for repeat evaluation.  Again discussed walking therapies at least 30 minutes a day 3 times a week.   Cephus Shelling, MD [facility] Office: [phone]

## 2023-11-29 ENCOUNTER — Ambulatory Visit (HOSPITAL_COMMUNITY)
Admission: RE | Admit: 2023-11-29 | Discharge: 2023-11-29 | Disposition: A | Discharge status: Home / Self Care | Payer: BC Managed Care – PPO | Source: Ambulatory Visit | Attending: Vascular Surgery | Admitting: Vascular Surgery

## 2023-11-29 ENCOUNTER — Encounter: Payer: Self-pay | Admitting: Vascular Surgery

## 2023-11-29 ENCOUNTER — Ambulatory Visit (INDEPENDENT_AMBULATORY_CARE_PROVIDER_SITE_OTHER): Payer: BC Managed Care – PPO | Admitting: Vascular Surgery

## 2023-11-29 VITALS — BP 116/77 | HR 55 | Temp 97.2°F | Resp 18 | Ht 65.5 in | Wt 148.1 lb

## 2023-11-29 DIAGNOSIS — I739 Peripheral vascular disease, unspecified: Secondary | ICD-10-CM | POA: Insufficient documentation

## 2023-11-29 LAB — VAS US ABI WITH/WO TBI
Left ABI: 0.65
Right ABI: 1.13

## 2023-12-01 DIAGNOSIS — E7849 Other hyperlipidemia: Secondary | ICD-10-CM | POA: Diagnosis not present

## 2023-12-01 DIAGNOSIS — R931 Abnormal findings on diagnostic imaging of heart and coronary circulation: Secondary | ICD-10-CM | POA: Diagnosis not present

## 2023-12-01 DIAGNOSIS — I10 Essential (primary) hypertension: Secondary | ICD-10-CM | POA: Diagnosis not present

## 2023-12-01 DIAGNOSIS — I739 Peripheral vascular disease, unspecified: Secondary | ICD-10-CM | POA: Diagnosis not present

## 2023-12-02 ENCOUNTER — Other Ambulatory Visit: Payer: Self-pay | Admitting: *Deleted

## 2023-12-02 DIAGNOSIS — I739 Peripheral vascular disease, unspecified: Secondary | ICD-10-CM

## 2024-01-05 DIAGNOSIS — M79645 Pain in left finger(s): Secondary | ICD-10-CM | POA: Diagnosis not present

## 2024-01-05 DIAGNOSIS — M67432 Ganglion, left wrist: Secondary | ICD-10-CM | POA: Diagnosis not present

## 2024-01-05 DIAGNOSIS — M65932 Unspecified synovitis and tenosynovitis, left forearm: Secondary | ICD-10-CM | POA: Diagnosis not present

## 2024-01-05 DIAGNOSIS — G5601 Carpal tunnel syndrome, right upper limb: Secondary | ICD-10-CM | POA: Diagnosis not present

## 2024-01-13 ENCOUNTER — Ambulatory Visit: Payer: Self-pay | Admitting: Cardiology

## 2024-01-17 DIAGNOSIS — M65832 Other synovitis and tenosynovitis, left forearm: Secondary | ICD-10-CM | POA: Diagnosis not present

## 2024-01-17 DIAGNOSIS — S66317A Strain of extensor muscle, fascia and tendon of left little finger at wrist and hand level, initial encounter: Secondary | ICD-10-CM | POA: Diagnosis not present

## 2024-01-17 DIAGNOSIS — M24132 Other articular cartilage disorders, left wrist: Secondary | ICD-10-CM | POA: Diagnosis not present

## 2024-01-17 DIAGNOSIS — M13832 Other specified arthritis, left wrist: Secondary | ICD-10-CM | POA: Diagnosis not present

## 2024-01-17 DIAGNOSIS — M65942 Unspecified synovitis and tenosynovitis, left hand: Secondary | ICD-10-CM | POA: Diagnosis not present

## 2024-01-17 DIAGNOSIS — S66315A Strain of extensor muscle, fascia and tendon of left ring finger at wrist and hand level, initial encounter: Secondary | ICD-10-CM | POA: Diagnosis not present

## 2024-01-17 DIAGNOSIS — G8918 Other acute postprocedural pain: Secondary | ICD-10-CM | POA: Diagnosis not present

## 2024-01-25 DIAGNOSIS — M25642 Stiffness of left hand, not elsewhere classified: Secondary | ICD-10-CM | POA: Diagnosis not present

## 2024-02-02 DIAGNOSIS — M25642 Stiffness of left hand, not elsewhere classified: Secondary | ICD-10-CM | POA: Diagnosis not present

## 2024-02-08 DIAGNOSIS — M25642 Stiffness of left hand, not elsewhere classified: Secondary | ICD-10-CM | POA: Diagnosis not present

## 2024-02-15 DIAGNOSIS — M25642 Stiffness of left hand, not elsewhere classified: Secondary | ICD-10-CM | POA: Diagnosis not present

## 2024-02-24 DIAGNOSIS — M25642 Stiffness of left hand, not elsewhere classified: Secondary | ICD-10-CM | POA: Diagnosis not present

## 2024-02-29 DIAGNOSIS — H52223 Regular astigmatism, bilateral: Secondary | ICD-10-CM | POA: Diagnosis not present

## 2024-02-29 DIAGNOSIS — H43813 Vitreous degeneration, bilateral: Secondary | ICD-10-CM | POA: Diagnosis not present

## 2024-02-29 DIAGNOSIS — H26493 Other secondary cataract, bilateral: Secondary | ICD-10-CM | POA: Diagnosis not present

## 2024-02-29 DIAGNOSIS — Z961 Presence of intraocular lens: Secondary | ICD-10-CM | POA: Diagnosis not present

## 2024-02-29 DIAGNOSIS — H5213 Myopia, bilateral: Secondary | ICD-10-CM | POA: Diagnosis not present

## 2024-02-29 DIAGNOSIS — H524 Presbyopia: Secondary | ICD-10-CM | POA: Diagnosis not present

## 2024-03-02 DIAGNOSIS — M25642 Stiffness of left hand, not elsewhere classified: Secondary | ICD-10-CM | POA: Diagnosis not present

## 2024-03-08 DIAGNOSIS — M25642 Stiffness of left hand, not elsewhere classified: Secondary | ICD-10-CM | POA: Diagnosis not present

## 2024-03-15 DIAGNOSIS — M25642 Stiffness of left hand, not elsewhere classified: Secondary | ICD-10-CM | POA: Diagnosis not present

## 2024-03-22 DIAGNOSIS — M25642 Stiffness of left hand, not elsewhere classified: Secondary | ICD-10-CM | POA: Diagnosis not present

## 2024-03-29 DIAGNOSIS — M25642 Stiffness of left hand, not elsewhere classified: Secondary | ICD-10-CM | POA: Diagnosis not present

## 2024-04-11 ENCOUNTER — Other Ambulatory Visit: Payer: Self-pay | Admitting: Obstetrics and Gynecology

## 2024-04-11 DIAGNOSIS — Z1231 Encounter for screening mammogram for malignant neoplasm of breast: Secondary | ICD-10-CM

## 2024-04-13 DIAGNOSIS — M25642 Stiffness of left hand, not elsewhere classified: Secondary | ICD-10-CM | POA: Diagnosis not present

## 2024-04-27 DIAGNOSIS — M25642 Stiffness of left hand, not elsewhere classified: Secondary | ICD-10-CM | POA: Diagnosis not present

## 2024-05-11 DIAGNOSIS — M25642 Stiffness of left hand, not elsewhere classified: Secondary | ICD-10-CM | POA: Diagnosis not present

## 2024-05-28 ENCOUNTER — Ambulatory Visit
Admission: RE | Admit: 2024-05-28 | Discharge: 2024-05-28 | Disposition: A | Discharge status: Home / Self Care | Source: Ambulatory Visit | Attending: Obstetrics and Gynecology | Admitting: Obstetrics and Gynecology

## 2024-05-28 DIAGNOSIS — Z1231 Encounter for screening mammogram for malignant neoplasm of breast: Secondary | ICD-10-CM

## 2024-06-05 ENCOUNTER — Encounter: Payer: Self-pay | Admitting: Vascular Surgery

## 2024-06-05 ENCOUNTER — Ambulatory Visit (INDEPENDENT_AMBULATORY_CARE_PROVIDER_SITE_OTHER): Admitting: Vascular Surgery

## 2024-06-05 ENCOUNTER — Ambulatory Visit (HOSPITAL_COMMUNITY)
Admission: RE | Admit: 2024-06-05 | Discharge: 2024-06-05 | Disposition: A | Discharge status: Home / Self Care | Source: Ambulatory Visit | Attending: Vascular Surgery | Admitting: Vascular Surgery

## 2024-06-05 VITALS — BP 117/78 | HR 65 | Temp 98.2°F | Resp 16 | Ht 65.5 in | Wt 143.0 lb

## 2024-06-05 DIAGNOSIS — I739 Peripheral vascular disease, unspecified: Secondary | ICD-10-CM | POA: Insufficient documentation

## 2024-06-05 LAB — VAS US ABI WITH/WO TBI
Left ABI: 0.64
Right ABI: 1.15

## 2024-06-05 NOTE — Progress Notes (Signed)
 Patient name: Morgan Leblanc MRN: [id number] DOB: 1961/05/06 Sex: female  REASON FOR CONSULT: 1-month follow-up, left leg claudication  HPI: Morgan Leblanc is a 63 y.o. female, with history of hyperlipidemia that presents for 6 month follow-up of her left leg claudication.  We previously elected to manage her conservatively.  She could not tolerate the Pletal  due to rapid heart rate and this was stopped.  She has remained on aspirin and statin.    Still walking about a block and a half before her left leg cramping starts.  Able to ultimately walk up to 2 miles without stopping.  No progression of symptoms since last visit.  Past Medical History:  Diagnosis Date   Claudication of left lower extremity    Hyperlipidemia    Hypertension    PAD (peripheral artery disease)     Past Surgical History:  Procedure Laterality Date   c section x 2     CATARACT EXTRACTION Bilateral     Family History  Problem Relation Age of Onset   Breast cancer Mother    Hypertension Father    Prostate cancer Father    Heart attack Maternal Grandfather     SOCIAL HISTORY: Social History   Socioeconomic History   Marital status: Married    Spouse name: Not on file   Number of children: Not on file   Years of education: Not on file   Highest education level: Not on file  Occupational History   Not on file  Tobacco Use   Smoking status: Never   Smokeless tobacco: Never  Vaping Use   Vaping status: Never Used  Substance and Sexual Activity   Alcohol use: No   Drug use: No   Sexual activity: Not on file  Other Topics Concern   Not on file  Social History Narrative   Not on file   Social Drivers of Health   Financial Resource Strain: Not on file  Food Insecurity: Not on file  Transportation Needs: Not on file  Physical Activity: Not on file  Stress: Not on file  Social Connections: Not on file  Intimate Partner Violence: Not on file    Allergies  Allergen Reactions   Cilostazol   Palpitations    Palps/headaches   Latex Rash    Current Outpatient Medications  Medication Sig Dispense Refill   aspirin EC 81 MG tablet Take 81 mg by mouth daily. Swallow whole.     atorvastatin  (LIPITOR) 20 MG tablet Take 1 tablet (20 mg total) by mouth daily. 90 tablet 3   cetirizine (ZYRTEC) 10 MG tablet Take 10 mg by mouth daily.     Cholecalciferol (VITAMIN D3) 1000 units CAPS Take 2,000 Units by mouth daily.     fluticasone (FLONASE) 50 MCG/ACT nasal spray Place 1 spray into both nostrils daily.     lisinopril (ZESTRIL) 5 MG tablet Take 1 tablet by mouth daily.     Multiple Vitamin (MULTIVITAMIN WITH MINERALS) TABS tablet Take 1 tablet by mouth daily.     ascorbic acid (VITAMIN C) 1000 MG tablet Take 500 mg by mouth daily.     No current facility-administered medications for this visit.    REVIEW OF SYSTEMS:  [X]  denotes positive finding, [ ]  denotes negative finding Cardiac  Comments:  Chest pain or chest pressure:    Shortness of breath upon exertion:    Short of breath when lying flat:    Irregular heart rhythm:  Vascular    Pain in calf, thigh, or hip brought on by ambulation: x Left leg  Pain in feet at night that wakes you up from your sleep:     Blood clot in your veins:    Leg swelling:         Pulmonary    Oxygen at home:    Productive cough:     Wheezing:         Neurologic    Sudden weakness in arms or legs:     Sudden numbness in arms or legs:     Sudden onset of difficulty speaking or slurred speech:    Temporary loss of vision in one eye:     Problems with dizziness:         Gastrointestinal    Blood in stool:     Vomited blood:         Genitourinary    Burning when urinating:     Blood in urine:        Psychiatric    Major depression:         Hematologic    Bleeding problems:    Problems with blood clotting too easily:        Skin    Rashes or ulcers:        Constitutional    Fever or chills:      PHYSICAL EXAM: Vitals:    06/05/24 1149  BP: 117/78  Pulse: 65  Resp: 16  Temp: 98.2 F (36.8 C)  TempSrc: Temporal  SpO2: 100%  Weight: 143 lb (64.9 kg)  Height: 5' 5.5 (1.664 m)     GENERAL: The patient is a well-nourished female, in no acute distress. The vital signs are documented above. CARDIAC: There is a regular rate and rhythm.  VASCULAR:  Palpable femoral pulses bilaterally Right DP 2+ palpable No left pedal pulses palpable, foot warm No lower extremity tissue loss PULMONARY: No respiratory distress. ABDOMEN: Soft and non-tender. MUSCULOSKELETAL: There are no major deformities or cyanosis. NEUROLOGIC: No focal weakness or paresthesias are detected. PSYCHIATRIC: The patient has a normal affect.  DATA:   ABIs today are 1.15 on the right triphasic and 0.64 on the left monophasic (previously 1.13 right triphasic and 0.65 left monophasic)    Assessment/Plan:  63 year old female presents for 6 month follow-up of her left lower extremity claudication.  Still endorsing left calf claudication usually after walking 1.5-blocks.  Able to walk through this and usually walking up to 2 miles at a time.  Symptoms remain stable over the last 6 months.  I again discussed taking a conservative approach until she has more disabling symptoms particularly being able to walk 2 miles at this time which is overall excellent.  She is on aspirin and statin for risk reduction.  Unable to tolerate Pletal .  Will see her in 6 months with [name].  Discussed continuing walking therapies  Lonni DOROTHA Gaskins, MD [facility] Office: [phone]

## 2024-06-07 ENCOUNTER — Other Ambulatory Visit: Payer: Self-pay | Admitting: *Deleted

## 2024-06-07 DIAGNOSIS — I739 Peripheral vascular disease, unspecified: Secondary | ICD-10-CM

## 2024-06-08 DIAGNOSIS — Z0000 Encounter for general adult medical examination without abnormal findings: Secondary | ICD-10-CM | POA: Diagnosis not present

## 2024-06-08 DIAGNOSIS — I10 Essential (primary) hypertension: Secondary | ICD-10-CM | POA: Diagnosis not present

## 2024-06-08 DIAGNOSIS — E7849 Other hyperlipidemia: Secondary | ICD-10-CM | POA: Diagnosis not present

## 2024-06-08 DIAGNOSIS — Z8639 Personal history of other endocrine, nutritional and metabolic disease: Secondary | ICD-10-CM | POA: Diagnosis not present

## 2024-06-11 DIAGNOSIS — Z0000 Encounter for general adult medical examination without abnormal findings: Secondary | ICD-10-CM | POA: Diagnosis not present

## 2024-06-11 DIAGNOSIS — R931 Abnormal findings on diagnostic imaging of heart and coronary circulation: Secondary | ICD-10-CM | POA: Diagnosis not present

## 2024-06-11 DIAGNOSIS — I739 Peripheral vascular disease, unspecified: Secondary | ICD-10-CM | POA: Diagnosis not present

## 2024-06-11 DIAGNOSIS — I10 Essential (primary) hypertension: Secondary | ICD-10-CM | POA: Diagnosis not present

## 2024-06-11 DIAGNOSIS — Z23 Encounter for immunization: Secondary | ICD-10-CM | POA: Diagnosis not present

## 2024-06-11 DIAGNOSIS — E7849 Other hyperlipidemia: Secondary | ICD-10-CM | POA: Diagnosis not present

## 2024-12-04 ENCOUNTER — Encounter (HOSPITAL_COMMUNITY)

## 2024-12-04 ENCOUNTER — Ambulatory Visit: Admitting: Vascular Surgery
